# Patient Record
Sex: Female | Born: 1935 | Race: White | Hispanic: No
Health system: Southern US, Community
[De-identification: ages and names within clinical notes are randomized; demographics above are authoritative.]

## PROBLEM LIST (undated history)

## (undated) DIAGNOSIS — K219 Gastro-esophageal reflux disease without esophagitis: Secondary | ICD-10-CM

## (undated) DIAGNOSIS — J449 Chronic obstructive pulmonary disease, unspecified: Secondary | ICD-10-CM

## (undated) DIAGNOSIS — F419 Anxiety disorder, unspecified: Secondary | ICD-10-CM

## (undated) DIAGNOSIS — J189 Pneumonia, unspecified organism: Secondary | ICD-10-CM

## (undated) DIAGNOSIS — I10 Essential (primary) hypertension: Secondary | ICD-10-CM

## (undated) DIAGNOSIS — E46 Unspecified protein-calorie malnutrition: Secondary | ICD-10-CM

## (undated) DIAGNOSIS — N319 Neuromuscular dysfunction of bladder, unspecified: Secondary | ICD-10-CM

## (undated) DIAGNOSIS — Z9911 Dependence on respirator [ventilator] status: Secondary | ICD-10-CM

## (undated) DIAGNOSIS — A0472 Enterocolitis due to Clostridium difficile, not specified as recurrent: Secondary | ICD-10-CM

## (undated) DIAGNOSIS — D649 Anemia, unspecified: Secondary | ICD-10-CM

## (undated) DIAGNOSIS — I5181 Takotsubo syndrome: Secondary | ICD-10-CM

---

## 2016-03-31 ENCOUNTER — Emergency Department (HOSPITAL_COMMUNITY): Payer: Medicare Other

## 2016-03-31 ENCOUNTER — Inpatient Hospital Stay (HOSPITAL_COMMUNITY)
Admission: EM | Admit: 2016-03-31 | Discharge: 2016-04-11 | DRG: 870 | Disposition: A | Discharge status: Other Acute Inpatient Hospital | Payer: Medicare Other | Attending: Oncology | Admitting: Oncology

## 2016-03-31 ENCOUNTER — Encounter (HOSPITAL_COMMUNITY): Payer: Self-pay | Admitting: *Deleted

## 2016-03-31 DIAGNOSIS — E2740 Unspecified adrenocortical insufficiency: Secondary | ICD-10-CM | POA: Diagnosis present

## 2016-03-31 DIAGNOSIS — E1165 Type 2 diabetes mellitus with hyperglycemia: Secondary | ICD-10-CM | POA: Diagnosis present

## 2016-03-31 DIAGNOSIS — J440 Chronic obstructive pulmonary disease with acute lower respiratory infection: Secondary | ICD-10-CM | POA: Diagnosis present

## 2016-03-31 DIAGNOSIS — R739 Hyperglycemia, unspecified: Secondary | ICD-10-CM | POA: Diagnosis present

## 2016-03-31 DIAGNOSIS — Z452 Encounter for adjustment and management of vascular access device: Secondary | ICD-10-CM

## 2016-03-31 DIAGNOSIS — F419 Anxiety disorder, unspecified: Secondary | ICD-10-CM | POA: Diagnosis present

## 2016-03-31 DIAGNOSIS — Z9911 Dependence on respirator [ventilator] status: Secondary | ICD-10-CM

## 2016-03-31 DIAGNOSIS — J150 Pneumonia due to Klebsiella pneumoniae: Secondary | ICD-10-CM | POA: Diagnosis present

## 2016-03-31 DIAGNOSIS — A0472 Enterocolitis due to Clostridium difficile, not specified as recurrent: Secondary | ICD-10-CM | POA: Diagnosis present

## 2016-03-31 DIAGNOSIS — L8990 Pressure ulcer of unspecified site, unspecified stage: Secondary | ICD-10-CM | POA: Diagnosis present

## 2016-03-31 DIAGNOSIS — E875 Hyperkalemia: Secondary | ICD-10-CM | POA: Diagnosis present

## 2016-03-31 DIAGNOSIS — A4159 Other Gram-negative sepsis: Secondary | ICD-10-CM | POA: Diagnosis present

## 2016-03-31 DIAGNOSIS — J95851 Ventilator associated pneumonia: Secondary | ICD-10-CM | POA: Diagnosis present

## 2016-03-31 DIAGNOSIS — B961 Klebsiella pneumoniae [K. pneumoniae] as the cause of diseases classified elsewhere: Secondary | ICD-10-CM | POA: Diagnosis not present

## 2016-03-31 DIAGNOSIS — B952 Enterococcus as the cause of diseases classified elsewhere: Secondary | ICD-10-CM | POA: Diagnosis present

## 2016-03-31 DIAGNOSIS — E11649 Type 2 diabetes mellitus with hypoglycemia without coma: Secondary | ICD-10-CM | POA: Diagnosis present

## 2016-03-31 DIAGNOSIS — A0471 Enterocolitis due to Clostridium difficile, recurrent: Secondary | ICD-10-CM | POA: Diagnosis not present

## 2016-03-31 DIAGNOSIS — E871 Hypo-osmolality and hyponatremia: Secondary | ICD-10-CM | POA: Diagnosis present

## 2016-03-31 DIAGNOSIS — D638 Anemia in other chronic diseases classified elsewhere: Secondary | ICD-10-CM | POA: Diagnosis not present

## 2016-03-31 DIAGNOSIS — B9689 Other specified bacterial agents as the cause of diseases classified elsewhere: Secondary | ICD-10-CM

## 2016-03-31 DIAGNOSIS — E119 Type 2 diabetes mellitus without complications: Secondary | ICD-10-CM | POA: Diagnosis not present

## 2016-03-31 DIAGNOSIS — I950 Idiopathic hypotension: Secondary | ICD-10-CM | POA: Diagnosis present

## 2016-03-31 DIAGNOSIS — S301XXA Contusion of abdominal wall, initial encounter: Secondary | ICD-10-CM | POA: Diagnosis present

## 2016-03-31 DIAGNOSIS — Y95 Nosocomial condition: Secondary | ICD-10-CM | POA: Diagnosis present

## 2016-03-31 DIAGNOSIS — Z79899 Other long term (current) drug therapy: Secondary | ICD-10-CM

## 2016-03-31 DIAGNOSIS — N390 Urinary tract infection, site not specified: Secondary | ICD-10-CM | POA: Diagnosis present

## 2016-03-31 DIAGNOSIS — E46 Unspecified protein-calorie malnutrition: Secondary | ICD-10-CM | POA: Diagnosis present

## 2016-03-31 DIAGNOSIS — Z794 Long term (current) use of insulin: Secondary | ICD-10-CM

## 2016-03-31 DIAGNOSIS — J9621 Acute and chronic respiratory failure with hypoxia: Secondary | ICD-10-CM | POA: Diagnosis present

## 2016-03-31 DIAGNOSIS — A414 Sepsis due to anaerobes: Secondary | ICD-10-CM

## 2016-03-31 DIAGNOSIS — D649 Anemia, unspecified: Secondary | ICD-10-CM | POA: Diagnosis not present

## 2016-03-31 DIAGNOSIS — Z8674 Personal history of sudden cardiac arrest: Secondary | ICD-10-CM

## 2016-03-31 DIAGNOSIS — A498 Other bacterial infections of unspecified site: Secondary | ICD-10-CM | POA: Diagnosis not present

## 2016-03-31 DIAGNOSIS — I10 Essential (primary) hypertension: Secondary | ICD-10-CM | POA: Diagnosis present

## 2016-03-31 DIAGNOSIS — Z930 Tracheostomy status: Secondary | ICD-10-CM | POA: Diagnosis not present

## 2016-03-31 DIAGNOSIS — E785 Hyperlipidemia, unspecified: Secondary | ICD-10-CM | POA: Diagnosis present

## 2016-03-31 DIAGNOSIS — Z7952 Long term (current) use of systemic steroids: Secondary | ICD-10-CM

## 2016-03-31 DIAGNOSIS — R093 Abnormal sputum: Secondary | ICD-10-CM

## 2016-03-31 DIAGNOSIS — Z6825 Body mass index (BMI) 25.0-25.9, adult: Secondary | ICD-10-CM

## 2016-03-31 DIAGNOSIS — K9420 Gastrostomy complication, unspecified: Secondary | ICD-10-CM

## 2016-03-31 DIAGNOSIS — Y848 Other medical procedures as the cause of abnormal reaction of the patient, or of later complication, without mention of misadventure at the time of the procedure: Secondary | ICD-10-CM | POA: Diagnosis present

## 2016-03-31 DIAGNOSIS — K219 Gastro-esophageal reflux disease without esophagitis: Secondary | ICD-10-CM | POA: Diagnosis not present

## 2016-03-31 DIAGNOSIS — I5181 Takotsubo syndrome: Secondary | ICD-10-CM | POA: Diagnosis present

## 2016-03-31 DIAGNOSIS — K651 Peritoneal abscess: Secondary | ICD-10-CM | POA: Diagnosis not present

## 2016-03-31 DIAGNOSIS — J9601 Acute respiratory failure with hypoxia: Secondary | ICD-10-CM

## 2016-03-31 DIAGNOSIS — L309 Dermatitis, unspecified: Secondary | ICD-10-CM | POA: Diagnosis present

## 2016-03-31 DIAGNOSIS — J189 Pneumonia, unspecified organism: Secondary | ICD-10-CM | POA: Diagnosis not present

## 2016-03-31 DIAGNOSIS — Z8701 Personal history of pneumonia (recurrent): Secondary | ICD-10-CM

## 2016-03-31 DIAGNOSIS — L03311 Cellulitis of abdominal wall: Secondary | ICD-10-CM | POA: Diagnosis present

## 2016-03-31 DIAGNOSIS — Z66 Do not resuscitate: Secondary | ICD-10-CM | POA: Diagnosis present

## 2016-03-31 DIAGNOSIS — Z888 Allergy status to other drugs, medicaments and biological substances status: Secondary | ICD-10-CM

## 2016-03-31 DIAGNOSIS — L02211 Cutaneous abscess of abdominal wall: Secondary | ICD-10-CM | POA: Diagnosis present

## 2016-03-31 DIAGNOSIS — Z1621 Resistance to vancomycin: Secondary | ICD-10-CM | POA: Diagnosis present

## 2016-03-31 DIAGNOSIS — A491 Streptococcal infection, unspecified site: Secondary | ICD-10-CM

## 2016-03-31 DIAGNOSIS — L8942 Pressure ulcer of contiguous site of back, buttock and hip, stage 2: Secondary | ICD-10-CM | POA: Diagnosis not present

## 2016-03-31 DIAGNOSIS — J9610 Chronic respiratory failure, unspecified whether with hypoxia or hypercapnia: Secondary | ICD-10-CM | POA: Diagnosis not present

## 2016-03-31 HISTORY — DX: Takotsubo syndrome: I51.81

## 2016-03-31 HISTORY — DX: Neuromuscular dysfunction of bladder, unspecified: N31.9

## 2016-03-31 HISTORY — DX: Dependence on respirator (ventilator) status: Z99.11

## 2016-03-31 HISTORY — DX: Anxiety disorder, unspecified: F41.9

## 2016-03-31 HISTORY — DX: Pneumonia, unspecified organism: J18.9

## 2016-03-31 HISTORY — DX: Essential (primary) hypertension: I10

## 2016-03-31 HISTORY — DX: Anemia, unspecified: D64.9

## 2016-03-31 HISTORY — DX: Chronic obstructive pulmonary disease, unspecified: J44.9

## 2016-03-31 HISTORY — DX: Gastro-esophageal reflux disease without esophagitis: K21.9

## 2016-03-31 HISTORY — DX: Enterocolitis due to Clostridium difficile, not specified as recurrent: A04.72

## 2016-03-31 HISTORY — DX: Unspecified protein-calorie malnutrition: E46

## 2016-03-31 LAB — COMPREHENSIVE METABOLIC PANEL
ALBUMIN: 1.9 g/dL — AB (ref 3.5–5.0)
ALT: 26 U/L (ref 14–54)
AST: 22 U/L (ref 15–41)
Alkaline Phosphatase: 100 U/L (ref 38–126)
Anion gap: 8 (ref 5–15)
BILIRUBIN TOTAL: 0.3 mg/dL (ref 0.3–1.2)
BUN: 50 mg/dL — AB (ref 6–20)
CO2: 22 mmol/L (ref 22–32)
CREATININE: 0.7 mg/dL (ref 0.44–1.00)
Calcium: 8.7 mg/dL — ABNORMAL LOW (ref 8.9–10.3)
Chloride: 97 mmol/L — ABNORMAL LOW (ref 101–111)
GFR calc Af Amer: >60 mL/min (ref >60)
GLUCOSE: 481 mg/dL — AB (ref 65–99)
POTASSIUM: 6.2 mmol/L — AB (ref 3.5–5.1)
Sodium: 127 mmol/L — ABNORMAL LOW (ref 135–145)
TOTAL PROTEIN: 5.6 g/dL — AB (ref 6.5–8.1)

## 2016-03-31 LAB — I-STAT ARTERIAL BLOOD GAS, ED
Acid-base deficit: 2 mmol/L (ref 0.0–2.0)
BICARBONATE: 22.6 mmol/L (ref 20.0–28.0)
O2 Saturation: 97 %
PCO2 ART: 38.1 mmHg (ref 32.0–48.0)
PO2 ART: 88 mmHg (ref 83.0–108.0)
TCO2: 24 mmol/L (ref 0–100)
pH, Arterial: 7.382 (ref 7.350–7.450)

## 2016-03-31 LAB — I-STAT CHEM 8, ED
BUN: 46 mg/dL — AB (ref 6–20)
CALCIUM ION: 1.16 mmol/L (ref 1.15–1.40)
CREATININE: 0.6 mg/dL (ref 0.44–1.00)
Chloride: 100 mmol/L — ABNORMAL LOW (ref 101–111)
GLUCOSE: 450 mg/dL — AB (ref 65–99)
HCT: 37 % (ref 36.0–46.0)
HEMOGLOBIN: 12.6 g/dL (ref 12.0–15.0)
Potassium: 6.4 mmol/L (ref 3.5–5.1)
Sodium: 124 mmol/L — ABNORMAL LOW (ref 135–145)
TCO2: 23 mmol/L (ref 0–100)

## 2016-03-31 LAB — CBC WITH DIFFERENTIAL/PLATELET
BASOS ABS: 0 10*3/uL (ref 0.0–0.1)
Basophils Relative: 0 %
EOS PCT: 0 %
Eosinophils Absolute: 0 10*3/uL (ref 0.0–0.7)
HEMATOCRIT: 20.1 % — AB (ref 36.0–46.0)
Hemoglobin: 6.4 g/dL — CL (ref 12.0–15.0)
LYMPHS ABS: 0.4 10*3/uL — AB (ref 0.7–4.0)
Lymphocytes Relative: 2 %
MCH: 29.5 pg (ref 26.0–34.0)
MCHC: 31.8 g/dL (ref 30.0–36.0)
MCV: 92.6 fL (ref 78.0–100.0)
MONO ABS: 0.2 10*3/uL (ref 0.1–1.0)
MONOS PCT: 1 %
NEUTROS ABS: 18 10*3/uL — AB (ref 1.7–7.7)
Neutrophils Relative %: 97 %
PLATELETS: 262 10*3/uL (ref 150–400)
RBC: 2.17 MIL/uL — AB (ref 3.87–5.11)
RDW: 16.6 % — AB (ref 11.5–15.5)
WBC: 18.6 10*3/uL — AB (ref 4.0–10.5)

## 2016-03-31 LAB — I-STAT TROPONIN, ED: Troponin i, poc: 0.04 ng/mL (ref 0.00–0.08)

## 2016-03-31 LAB — PREPARE RBC (CROSSMATCH)

## 2016-03-31 LAB — BETA-HYDROXYBUTYRIC ACID: Beta-Hydroxybutyric Acid: 0.23 mmol/L (ref 0.05–0.27)

## 2016-03-31 LAB — I-STAT CG4 LACTIC ACID, ED: Lactic Acid, Venous: 1.7 mmol/L (ref 0.5–1.9)

## 2016-03-31 MED ORDER — PIPERACILLIN-TAZOBACTAM 3.375 G IVPB 30 MIN
3.3750 g | Freq: Once | INTRAVENOUS | Status: AC
  Administered 2016-04-01: 3.375 g via INTRAVENOUS
  Filled 2016-03-31: qty 50

## 2016-03-31 MED ORDER — SODIUM CHLORIDE 0.9 % IV SOLN
Freq: Once | INTRAVENOUS | Status: AC
  Administered 2016-04-01: 01:00:00 via INTRAVENOUS

## 2016-03-31 MED ORDER — SODIUM CHLORIDE 0.9 % IV SOLN
1.0000 g | Freq: Once | INTRAVENOUS | Status: AC
  Administered 2016-03-31: 1 g via INTRAVENOUS
  Filled 2016-03-31: qty 10

## 2016-03-31 NOTE — H&P (Signed)
Date: 03/31/2016               Patient Name:  Janice BlackwaterCatherine Jabbour MRN: [id number]  DOB: [birth year] Age / Sex: 82 y.o., female   PCP: No primary care provider on file.         Medical Service: Internal Medicine Teaching Service         Attending Physician: Dr. Blane OharaJoshua Zavitz, MD    First Contact: Dr. Samuella CotaSvalina  Pager: [phone]  Second Contact: Dr. Johnny BridgeSaraiya Pager: [phone]       After Hours (After 5p/  First Contact Pager: [phone]  weekends / holidays): Second Contact Pager: [phone]   Chief Complaint: Abnormal labs  History of Present Illness: Patient is an 82 yo F ventilator dependent with a trach, pmhx of HTN, COPD, and GERD who [facility] for reported hyperkalemia. Patient is non verbal and only able to answer yes/no questions with a nod. She is alert to person, place, and time with yes/no questioning. She is not feeling well. She admits to chest pain, shortness of breath, but denies cough. She denies fever, chills, dysuria. When asked if we could contact her family for further history, patient shook her head no.   In the ED, vitals were stable (T98.5 rectal, BP 116/68, HR 89, RR 21, Oxygen 99% on vent). Labs significant for hyperkalemia 6.2, hyponatremia 127, anemia hgb 6.4, leukocytosis wbc 18.6, and hyperglycemia with glucose 481. Venous lactic acid was elevated to 2.44, ABG was within normal limits. On repeat labs, hemoglobin was 12.6 but potassium remained elevated at 6.4. EKG was concerning for some mild ST elevation and depression, no priors in our system for comparison. I-stat troponin was 0.04. She was given  IV calcium gluconate x 1 and potassium improved to 4.1. CXR 1 view noted some mild bilateral opacities possibly atelectasis vs mild PNA as well as some peribronchial thickening. She was started on IV zosyn for pseudomonal coverage.   Meds:  Current Meds  Medication Sig  . busPIRone (BUSPAR) 10 MG tablet Place 10 mg into feeding tube 3 (three)  times daily.  . chlorhexidine (PERIDEX) 0.12 % solution Use as directed 15 mLs in the mouth or throat 2 (two) times daily.  . cholestyramine (QUESTRAN) 4 g packet Place 4 g into feeding tube 4 (four) times daily.  Marland Kitchen. dexamethasone (DECADRON) 4 MG tablet Place 4 mg into feeding tube daily.  . ertapenem 1 g in sodium chloride 0.9 % 50 mL Inject 1 g into the vein daily.  . famotidine (PEPCID) 20 MG tablet Place 20 mg into feeding tube daily.  . fluconazole (DIFLUCAN) 200 MG tablet Place 200 mg into feeding tube daily.  . heparin 5000 UNIT/ML injection Inject 5,000 Units into the skin 2 (two) times daily.  . insulin glargine (LANTUS) 100 UNIT/ML injection Inject 10 Units into the skin at bedtime.  . insulin lispro (HUMALOG) 100 UNIT/ML injection Inject 2-10 Units into the skin every 6 (six) hours. 0-150=0 units 151-200=2 units 201-250=4 units 251-300=6 units 301-350=8 units 351-400=10 unts >400=call MD  . insulin lispro (HUMALOG) 100 UNIT/ML injection Inject 10 Units into the skin daily.  Marland Kitchen. ipratropium-albuterol (DUONEB) 0.5-2.5 (3) MG/3ML SOLN Take 3 mLs by nebulization every 6 (six) hours.  Marland Kitchen. loperamide (IMODIUM A-D) 2 MG tablet Place 2 mg into feeding tube 4 (four) times daily as needed for diarrhea or loose stools.  Marland Kitchen. LORazepam (ATIVAN) 0.5 MG tablet Place 0.5 mg into feeding tube every 8 (eight) hours.  .Marland Kitchen  mirtazapine (REMERON) 15 MG tablet Place 15 mg into feeding tube at bedtime.  . Nutritional Supplements (FEEDING SUPPLEMENT, GLUCERNA 1.2 CAL,) LIQD Place 1,000 mLs into feeding tube continuous.  Marland Kitchen. oxyCODONE-acetaminophen (PERCOCET/ROXICET) 5-325 MG tablet Place 1 tablet into feeding tube every 6 (six) hours as needed for severe pain.  Marland Kitchen. terbinafine (LAMISIL) 1 % cream Apply 1 application topically 2 (two) times daily.  . traMADol (ULTRAM) 50 MG tablet Place 25 mg into feeding tube 2 (two) times daily.  Marland Kitchen. VANCOMYCIN HCL PO Place 500 mg into feeding tube every 6 (six) hours.      Allergies: Allergies as of 03/31/2016 - Review Complete 03/31/2016  Allergen Reaction Noted  . Tape Itching 03/20/2012   Past Medical History:  Diagnosis Date  . Anxiety   . C. difficile colitis   . Chronic anemia   . COPD (chronic obstructive pulmonary disease) (HCC)   . GERD (gastroesophageal reflux disease)   . Healthcare-associated pneumonia   . Hypertension   . Neuropathic bladder   . Pneumonia   . Protein calorie malnutrition (HCC)   . Takotsubo cardiomyopathy   . Ventilator dependent (HCC)     Family History: Unable to obtain   Social History: Ventilator dependent, non verbal at baseline. Can answer yes / no questions with nodding. [facility].  Review of Systems: A complete ROS was limited due to non verbal baseline.   Physical Exam: Blood pressure 110/66, pulse 83, temperature 98.5 F (36.9 C), temperature source Rectal, resp. rate 24, SpO2 99 %. Physical Exam Constitutional: Diaphoretic, ill appearing woman  HEENT: Atraumatic, normocephalic. PERRL, anicteric sclera.  Neck: Tracheostomy Cardiovascular: RRR, no murmurs, rubs, or gallops.  Pulmonary/Chest: On ventilator, CTAB, no wheezes, rales, or rhonchi.  Abdominal: Soft, non tender, non distended. +BS.  Extremities: Warm and well perfused. Distal pulses intact. No edema.  Neurological: A&Ox3 with yes/no questioning, CN II - XII grossly intact.  Skin: No rashes or erythema  Psychiatric: Normal mood and affect  EKG: Personally reviewed. NSR with T wave inversions in V1-V2, wide QRS, and J point elevation in V4-V6. No priors for comparison.   CXR: Personally reviewed. Patchy bilateral opacities, L>R. Possible PNA.  Assessment & Plan by Problem:  Hyperkalemia: S/p IV Calcium gluconate x 1 in ED. Improved 6.2 -> 4.1. EKG without peaked T waves. Possible RBBB and T wave inversions in V1-V2. I-stat troponin 0.04.  -- Trend troponins  -- Cardiac monitoring  -- Repeat AM labs  PNA:  Patchy bilateral opacities on CXR. Patient is vent dependent with trach, non verbal at baseline. She does endorse chest pain. Afebrile with leukocytosis of 18. . Patient may have been receiving Vancomycin and Ertapenem at [facility]; however, we have no records to support this-only an Epic order in home meds. She does have a history of Enterobacter Cloacae in sputum. Continuing Zosyn for now, but we need records ASAP. Also obtained sputum culture. -- Continue IV Zosyn  -- Sputum culture -- Continue home percocet per tube q6hrs prn  -- Continue home tramadol 25 mg BID -- Duonebs q6hrs scheduled -- Continue dexamethasone 4 mg daily  -- SLP eval  Hyponatremia: Sodium 127 on admission labs -- NS 125 cc/hr x 10 hrs -- Repeat AM labs  Anemia: Hgb 6.4 on admission but came up to 12.6 on repeat prior to blood transfusion. Possibly lab error, will repeat H&H. Hold off on transfusion for now.  -- FOBT -- H&H pending   DM II: -- Lantus 10 mg  QHS -- SSI  HLD: -- Continue home Cholestyramine 4 mg QID per tube  GERD: -- Continue home pepcid 20 mg per tube daily   Anxiety: -- Continue home Buspar -- Continue home ativan 0.5 q8hrs prn per tube -- Continue home remeron 15 mg QHS  Diarrhea: -- Continue home loperamide   FEN: NS 125 cc/hr x 10 hours, replete lytes prn, tube feeds and dysphagia 1, SLP eval pending VTE ppx: Lovenox  Code Status: DNR  Dispo: Admit patient to Inpatient with expected length of stay greater than 2 midnights.  Signed: Reymundo Poll, MD 03/31/2016, 11:26 PM  Pager: [phone]

## 2016-03-31 NOTE — ED Provider Notes (Signed)
[facility]-EMERGENCY DEPT Provider Note   CSN: [id number] Arrival date & time: 03/31/16  1940     History   Chief Complaint Chief Complaint  Patient presents with   Other    Hyperkalemia    HPI Janice Watson is a 82 y.o. female.   Illness  This is a new problem. The current episode started 3 to 5 hours ago. The problem occurs constantly. The problem has not changed since onset.Associated symptoms comments: Hyperkalemia. Nothing aggravates the symptoms. Nothing relieves the symptoms. She has tried nothing for the symptoms.    Past Medical History:  Diagnosis Date   Anxiety    C. difficile colitis    Chronic anemia    COPD (chronic obstructive pulmonary disease) (HCC)    GERD (gastroesophageal reflux disease)    Healthcare-associated pneumonia    Hypertension    Neuropathic bladder    Pneumonia    Protein calorie malnutrition (HCC)    Takotsubo cardiomyopathy    Ventilator dependent [facility](HCC)     Patient Active Problem List   Diagnosis Date Noted   Anemia 03/31/2016    No past surgical history on file.  OB History    No data available       Home Medications    Prior to Admission medications   Medication Sig Start Date End Date Taking? Authorizing Provider  busPIRone (BUSPAR) 10 MG tablet Place 10 mg into feeding tube 3 (three) times daily.   Yes Historical Provider, MD  chlorhexidine (PERIDEX) 0.12 % solution Use as directed 15 mLs in the mouth or throat 2 (two) times daily.   Yes Historical Provider, MD  cholestyramine (QUESTRAN) 4 g packet Place 4 g into feeding tube 4 (four) times daily.   Yes Historical Provider, MD  dexamethasone (DECADRON) 4 MG tablet Place 4 mg into feeding tube daily.   Yes Historical Provider, MD  ertapenem 1 g in sodium chloride 0.9 % 50 mL Inject 1 g into the vein daily.   Yes Historical Provider, MD  famotidine (PEPCID) 20 MG tablet Place 20 mg into feeding tube daily.   Yes Historical Provider, MD  fluconazole  (DIFLUCAN) 200 MG tablet Place 200 mg into feeding tube daily.   Yes Historical Provider, MD  heparin 5000 UNIT/ML injection Inject 5,000 Units into the skin 2 (two) times daily.   Yes Historical Provider, MD  insulin glargine (LANTUS) 100 UNIT/ML injection Inject 10 Units into the skin at bedtime.   Yes Historical Provider, MD  insulin lispro (HUMALOG) 100 UNIT/ML injection Inject 2-10 Units into the skin every 6 (six) hours. 0-150=0 units 151-200=2 units 201-250=4 units 251-300=6 units 301-350=8 units 351-400=10 unts >400=call MD   Yes Historical Provider, MD  insulin lispro (HUMALOG) 100 UNIT/ML injection Inject 10 Units into the skin daily.   Yes Historical Provider, MD  ipratropium-albuterol (DUONEB) 0.5-2.5 (3) MG/3ML SOLN Take 3 mLs by nebulization every 6 (six) hours.   Yes Historical Provider, MD  loperamide (IMODIUM A-D) 2 MG tablet Place 2 mg into feeding tube 4 (four) times daily as needed for diarrhea or loose stools.   Yes Historical Provider, MD  LORazepam (ATIVAN) 0.5 MG tablet Place 0.5 mg into feeding tube every 8 (eight) hours.   Yes Historical Provider, MD  mirtazapine (REMERON) 15 MG tablet Place 15 mg into feeding tube at bedtime.   Yes Historical Provider, MD  Nutritional Supplements (FEEDING SUPPLEMENT, GLUCERNA 1.2 CAL,) LIQD Place 1,000 mLs into feeding tube continuous.   Yes Historical Provider, MD  oxyCODONE-acetaminophen (  PERCOCET/ROXICET) 5-325 MG tablet Place 1 tablet into feeding tube every 6 (six) hours as needed for severe pain.   Yes Historical Provider, MD  terbinafine (LAMISIL) 1 % cream Apply 1 application topically 2 (two) times daily.   Yes Historical Provider, MD  traMADol (ULTRAM) 50 MG tablet Place 25 mg into feeding tube 2 (two) times daily.   Yes Historical Provider, MD  VANCOMYCIN HCL PO Place 500 mg into feeding tube every 6 (six) hours.   Yes Historical Provider, MD    Family History No family history on file.  Social History Social History    Substance Use Topics   Smoking status: Not on file   Smokeless tobacco: Not on file   Alcohol use Not on file     Allergies   Tape   Review of Systems Review of Systems  Unable to perform ROS: Acuity of condition  All other systems reviewed and are negative.    Physical Exam Updated Vital Signs BP 110/66    Pulse 83    Temp 98.5 F (36.9 C) (Rectal)    Resp 24    SpO2 99%   Physical Exam  Constitutional: She appears well-developed and well-nourished. She appears lethargic. No distress.  HENT:  Head: Normocephalic and atraumatic.  Eyes: Conjunctivae are normal.  Neck: Neck supple.  Trach placed, clean dry and intact  Cardiovascular: Normal rate and regular rhythm.   No murmur heard. Pulmonary/Chest: Effort normal. No respiratory distress. She has rhonchi.  Mechanical lung sounds  Abdominal: Soft. There is no tenderness.  Musculoskeletal: She exhibits no edema.  Neurological: She appears lethargic. GCS eye subscore is 3.  Not following commands so unable to test motor function or sensation/coordination  Skin: Skin is warm and dry.  Psychiatric: She has a normal mood and affect.  Nursing note and vitals reviewed.    ED Treatments / Results  Labs (all labs ordered are listed, but only abnormal results are displayed) Labs Reviewed  COMPREHENSIVE METABOLIC PANEL - Abnormal; Notable for the following:       Result Value   Sodium 127 (*)    Potassium 6.2 (*)    Chloride 97 (*)    Glucose, Bld 481 (*)    BUN 50 (*)    Calcium 8.7 (*)    Total Protein 5.6 (*)    Albumin 1.9 (*)    All other components within normal limits  CBC WITH DIFFERENTIAL/PLATELET - Abnormal; Notable for the following:    WBC 18.6 (*)    RBC 2.17 (*)    Hemoglobin 6.4 (*)    HCT 20.1 (*)    RDW 16.6 (*)    All other components within normal limits  I-STAT CHEM 8, ED - Abnormal; Notable for the following:    Sodium 124 (*)    Potassium 6.4 (*)    Chloride 100 (*)    BUN 46 (*)     Glucose, Bld 450 (*)    All other components within normal limits  URINE CULTURE  BETA-HYDROXYBUTYRIC ACID  URINALYSIS, ROUTINE W REFLEX MICROSCOPIC (NOT AT [facility])  BLOOD GAS, ARTERIAL  BASIC METABOLIC PANEL  I-STAT CG4 LACTIC ACID, ED  I-STAT TROPOININ, ED  I-STAT ARTERIAL BLOOD GAS, ED  I-STAT CG4 LACTIC ACID, ED  TYPE AND SCREEN  PREPARE RBC (CROSSMATCH)    EKG  EKG Interpretation  Date/Time:  Saturday March 31 2016 20:07:05 EST Ventricular Rate:  88 PR Interval:    QRS Duration: 128 QT Interval:  373  QTC Calculation: 452 R Axis:   142 Text Interpretation:  Sinus rhythm Atrial premature complex RBBB and LPFB ST changes, no old ekg Confirmed by Jodi Mourning MD, JOSHUA [id number]) on 03/31/2016 8:14:32 PM       Radiology Dg Chest Port 1 View  Result Date: 03/31/2016 CLINICAL DATA:  PICC placement.  Initial encounter. EXAM: PORTABLE CHEST 1 VIEW COMPARISON:  None. FINDINGS: A right PICC is noted ending about the mid SVC. The patient's tracheostomy tube is seen ending 5 cm above the carina. The lungs are well-aerated. Mild bilateral opacities may reflect atelectasis or possibly mild pneumonia. Peribronchial thickening is seen. No definite pleural effusion or pneumothorax is seen. The cardiomediastinal silhouette is borderline normal in size. Chronic left-sided rib deformities are noted, and scattered soft tissue air is noted overlying the left chest wall, of uncertain significance. IMPRESSION: 1. Right PICC noted ending about the mid SVC. 2. Mild bilateral opacities may reflect atelectasis or possibly mild pneumonia. Peribronchial thickening seen. Electronically Signed   By: Roanna Raider M.D.   On: 03/31/2016 20:56    Procedures Procedures (including critical care time)  Medications Ordered in ED Medications  0.9 %  sodium chloride infusion (not administered)  piperacillin-tazobactam (ZOSYN) IVPB 3.375 g (not administered)  calcium gluconate 1 g in sodium chloride 0.9 % 100 mL  IVPB (0 g Intravenous Stopped 03/31/16 2205)     Initial Impression / Assessment and Plan / ED Course  I have reviewed the triage vital signs and the nursing notes.  Pertinent labs & imaging results that were available during my care of the patient were reviewed by me and considered in my medical decision making (see chart for details).  Clinical Course     82 year old female who is trached and vented pendant comes to [place] from her nursing home with concerns for hyperkalemia. This patient has pale conjunctiva, she is normotensive and she is not tachycardic. She is on her normal vent settings. Lung sound rhonchorous bilaterally mechanical sounds. Heart sounds normal. She's afebrile. Chest x-ray is concerning for multifocal pneumonia versus aspiration. With her trach, we will answer both with Zosyn. She is anemic to 52 units of blood are ordered. EKG shows some precordial ST depressions. As well as maybe 1 mm of elevation. Troponin is negative. Lactic acid is under 2. Stool is neither melenic or bright red blood. She will be admitted to the stepdown unit for further management and investigation of her anemia hyperkalemia and pneumonia. With regards to her hyperkalemia was 6.5. No peak T waves however there were EKG changes so we did give calcium. Will repeat a BMP 4 hours after. Vital signs stable time and off. Further manage this patient's care please see stepdown unit.  Final Clinical Impressions(s) / ED Diagnoses   Final diagnoses:  PICC (peripherally inserted central catheter) in place  Anemia, unspecified type  Hyperkalemia  HCAP (healthcare-associated pneumonia)    New Prescriptions New Prescriptions   No medications on file     Blane Ohara, MD 04/01/16 [phone]

## 2016-03-31 NOTE — ED Notes (Signed)
Patient comes from [facility] facility for reported hyperkalemia.  Potassium was 6.4 for the facility.  Patient is vent dependent with a trach.  Patient's cognition unknown baseline.  Responsive to pain only at this time.  Positive for pneumonia, c-diff at this time per report of EMS.

## 2016-03-31 NOTE — ED Notes (Signed)
Dr Jodi MourningZavitz was made aware of the critical lab..(KT)

## 2016-04-01 DIAGNOSIS — Z794 Long term (current) use of insulin: Secondary | ICD-10-CM

## 2016-04-01 DIAGNOSIS — A0472 Enterocolitis due to Clostridium difficile, not specified as recurrent: Secondary | ICD-10-CM

## 2016-04-01 DIAGNOSIS — Z960 Presence of urogenital implants: Secondary | ICD-10-CM

## 2016-04-01 DIAGNOSIS — F419 Anxiety disorder, unspecified: Secondary | ICD-10-CM

## 2016-04-01 DIAGNOSIS — Z79899 Other long term (current) drug therapy: Secondary | ICD-10-CM

## 2016-04-01 DIAGNOSIS — K219 Gastro-esophageal reflux disease without esophagitis: Secondary | ICD-10-CM

## 2016-04-01 DIAGNOSIS — E785 Hyperlipidemia, unspecified: Secondary | ICD-10-CM

## 2016-04-01 DIAGNOSIS — E119 Type 2 diabetes mellitus without complications: Secondary | ICD-10-CM

## 2016-04-01 DIAGNOSIS — B9689 Other specified bacterial agents as the cause of diseases classified elsewhere: Secondary | ICD-10-CM

## 2016-04-01 DIAGNOSIS — J95851 Ventilator associated pneumonia: Secondary | ICD-10-CM | POA: Diagnosis present

## 2016-04-01 DIAGNOSIS — R739 Hyperglycemia, unspecified: Secondary | ICD-10-CM | POA: Diagnosis present

## 2016-04-01 DIAGNOSIS — E871 Hypo-osmolality and hyponatremia: Secondary | ICD-10-CM | POA: Diagnosis present

## 2016-04-01 DIAGNOSIS — D638 Anemia in other chronic diseases classified elsewhere: Secondary | ICD-10-CM

## 2016-04-01 DIAGNOSIS — R197 Diarrhea, unspecified: Secondary | ICD-10-CM

## 2016-04-01 DIAGNOSIS — E875 Hyperkalemia: Secondary | ICD-10-CM | POA: Diagnosis present

## 2016-04-01 LAB — CBG MONITORING, ED: Glucose-Capillary: 346 mg/dL — ABNORMAL HIGH (ref 65–99)

## 2016-04-01 LAB — CBC
HEMATOCRIT: 34.8 % — AB (ref 36.0–46.0)
Hemoglobin: 11 g/dL — ABNORMAL LOW (ref 12.0–15.0)
MCH: 28.6 pg (ref 26.0–34.0)
MCHC: 31.6 g/dL (ref 30.0–36.0)
MCV: 90.4 fL (ref 78.0–100.0)
PLATELETS: 176 10*3/uL (ref 150–400)
RBC: 3.85 MIL/uL — AB (ref 3.87–5.11)
RDW: 15.9 % — AB (ref 11.5–15.5)
WBC: 10.2 10*3/uL (ref 4.0–10.5)

## 2016-04-01 LAB — BASIC METABOLIC PANEL
ANION GAP: 5 (ref 5–15)
Anion gap: 11 (ref 5–15)
Anion gap: 9 (ref 5–15)
BUN: 11 mg/dL (ref 6–20)
BUN: 50 mg/dL — AB (ref 6–20)
BUN: 42 mg/dL — ABNORMAL HIGH (ref 6–20)
CALCIUM: 8.7 mg/dL — AB (ref 8.9–10.3)
CALCIUM: 9.2 mg/dL (ref 8.9–10.3)
CHLORIDE: 104 mmol/L (ref 101–111)
CO2: 21 mmol/L — ABNORMAL LOW (ref 22–32)
CO2: 22 mmol/L (ref 22–32)
CO2: 21 mmol/L — AB (ref 22–32)
CREATININE: 0.79 mg/dL (ref 0.44–1.00)
CREATININE: 0.85 mg/dL (ref 0.44–1.00)
Calcium: 8.3 mg/dL — ABNORMAL LOW (ref 8.9–10.3)
Chloride: 106 mmol/L (ref 101–111)
Chloride: 97 mmol/L — ABNORMAL LOW (ref 101–111)
Creatinine, Ser: 0.75 mg/dL (ref 0.44–1.00)
GFR calc non Af Amer: >60 mL/min (ref >60)
GFR calc non Af Amer: >60 mL/min (ref >60)
Glucose, Bld: 101 mg/dL — ABNORMAL HIGH (ref 65–99)
Glucose, Bld: 382 mg/dL — ABNORMAL HIGH (ref 65–99)
Glucose, Bld: 198 mg/dL — ABNORMAL HIGH (ref 65–99)
POTASSIUM: 5.1 mmol/L (ref 3.5–5.1)
Potassium: 4.1 mmol/L (ref 3.5–5.1)
Potassium: 5.8 mmol/L — ABNORMAL HIGH (ref 3.5–5.1)
SODIUM: 130 mmol/L — AB (ref 135–145)
SODIUM: 138 mmol/L (ref 135–145)
Sodium: 128 mmol/L — ABNORMAL LOW (ref 135–145)

## 2016-04-01 LAB — URINE MICROSCOPIC-ADD ON

## 2016-04-01 LAB — URINALYSIS, ROUTINE W REFLEX MICROSCOPIC
Bilirubin Urine: NEGATIVE
GLUCOSE, UA: NEGATIVE mg/dL
KETONES UR: NEGATIVE mg/dL
Nitrite: NEGATIVE
PH: 6 (ref 5.0–8.0)
Protein, ur: 30 mg/dL — AB
SPECIFIC GRAVITY, URINE: 1.022 (ref 1.005–1.030)

## 2016-04-01 LAB — GLUCOSE, CAPILLARY
GLUCOSE-CAPILLARY: 150 mg/dL — AB (ref 65–99)
Glucose-Capillary: 362 mg/dL — ABNORMAL HIGH (ref 65–99)
Glucose-Capillary: 292 mg/dL — ABNORMAL HIGH (ref 65–99)
Glucose-Capillary: 162 mg/dL — ABNORMAL HIGH (ref 65–99)

## 2016-04-01 LAB — COMPREHENSIVE METABOLIC PANEL
ALT: 24 U/L (ref 14–54)
AST: 21 U/L (ref 15–41)
Albumin: 1.8 g/dL — ABNORMAL LOW (ref 3.5–5.0)
Alkaline Phosphatase: 98 U/L (ref 38–126)
Anion gap: 9 (ref 5–15)
BUN: 48 mg/dL — ABNORMAL HIGH (ref 6–20)
CO2: 21 mmol/L — ABNORMAL LOW (ref 22–32)
Calcium: 8.6 mg/dL — ABNORMAL LOW (ref 8.9–10.3)
Chloride: 100 mmol/L — ABNORMAL LOW (ref 101–111)
Creatinine, Ser: 0.93 mg/dL (ref 0.44–1.00)
GFR calc Af Amer: >60 mL/min (ref >60)
GFR calc non Af Amer: 56 mL/min — ABNORMAL LOW (ref >60)
Glucose, Bld: 391 mg/dL — ABNORMAL HIGH (ref 65–99)
Potassium: 4.8 mmol/L (ref 3.5–5.1)
Sodium: 130 mmol/L — ABNORMAL LOW (ref 135–145)
Total Bilirubin: 0.4 mg/dL (ref 0.3–1.2)
Total Protein: 5.4 g/dL — ABNORMAL LOW (ref 6.5–8.1)

## 2016-04-01 LAB — I-STAT CG4 LACTIC ACID, ED: LACTIC ACID, VENOUS: 2.44 mmol/L — AB (ref 0.5–1.9)

## 2016-04-01 LAB — TROPONIN I
TROPONIN I: 0.05 ng/mL — AB (ref <0.03)
Troponin I: 0.04 ng/mL (ref <0.03)

## 2016-04-01 LAB — HEMOGLOBIN AND HEMATOCRIT, BLOOD
HEMATOCRIT: 33.5 % — AB (ref 36.0–46.0)
HEMOGLOBIN: 10.5 g/dL — AB (ref 12.0–15.0)

## 2016-04-01 LAB — ABO/RH: ABO/RH(D): B NEG

## 2016-04-01 LAB — MAGNESIUM: Magnesium: 1.9 mg/dL (ref 1.7–2.4)

## 2016-04-01 LAB — PROCALCITONIN: Procalcitonin: 0.62 ng/mL

## 2016-04-01 LAB — MRSA PCR SCREENING: MRSA by PCR: NEGATIVE

## 2016-04-01 MED ORDER — SODIUM CHLORIDE 0.9 % IV SOLN
INTRAVENOUS | Status: AC
  Administered 2016-04-01: 06:00:00 via INTRAVENOUS

## 2016-04-01 MED ORDER — INSULIN ASPART 100 UNIT/ML ~~LOC~~ SOLN
0.0000 [IU] | Freq: Three times a day (TID) | SUBCUTANEOUS | Status: DC
  Administered 2016-04-01: 3 [IU] via SUBCUTANEOUS
  Administered 2016-04-01: 15 [IU] via SUBCUTANEOUS
  Administered 2016-04-01: 8 [IU] via SUBCUTANEOUS

## 2016-04-01 MED ORDER — FAMOTIDINE 20 MG PO TABS
20.0000 mg | ORAL_TABLET | Freq: Every day | ORAL | Status: DC
  Administered 2016-04-01 – 2016-04-11 (×11): 20 mg
  Filled 2016-04-01 (×11): qty 1

## 2016-04-01 MED ORDER — OXYCODONE-ACETAMINOPHEN 5-325 MG PO TABS: 1.0000 | ORAL_TABLET | Freq: Four times a day (QID) | ORAL | Status: DC | PRN

## 2016-04-01 MED ORDER — SODIUM CHLORIDE 0.9% FLUSH
3.0000 mL | Freq: Two times a day (BID) | INTRAVENOUS | Status: DC
  Administered 2016-04-01: 3 mL via INTRAVENOUS
  Administered 2016-04-01: 10 mL via INTRAVENOUS
  Administered 2016-04-02 – 2016-04-10 (×12): 3 mL via INTRAVENOUS

## 2016-04-01 MED ORDER — INSULIN ASPART 100 UNIT/ML ~~LOC~~ SOLN: 0.0000 [IU] | Freq: Every day | SUBCUTANEOUS | Status: DC

## 2016-04-01 MED ORDER — DEXTROSE 5 % IV SOLN
1.0000 g | Freq: Two times a day (BID) | INTRAVENOUS | Status: DC
  Administered 2016-04-01 – 2016-04-04 (×7): 1 g via INTRAVENOUS
  Filled 2016-04-01 (×8): qty 1

## 2016-04-01 MED ORDER — PIPERACILLIN-TAZOBACTAM 3.375 G IVPB
3.3750 g | Freq: Three times a day (TID) | INTRAVENOUS | Status: DC
  Filled 2016-04-01: qty 50

## 2016-04-01 MED ORDER — VANCOMYCIN 50 MG/ML ORAL SOLUTION
125.0000 mg | Freq: Four times a day (QID) | ORAL | Status: DC
  Administered 2016-04-01 – 2016-04-04 (×12): 125 mg via ORAL
  Filled 2016-04-01 (×14): qty 2.5

## 2016-04-01 MED ORDER — LOPERAMIDE HCL 2 MG PO CAPS
2.0000 mg | ORAL_CAPSULE | Freq: Four times a day (QID) | ORAL | Status: DC | PRN
  Administered 2016-04-02: 2 mg via ORAL
  Filled 2016-04-01 (×2): qty 1

## 2016-04-01 MED ORDER — SODIUM POLYSTYRENE SULFONATE 15 GM/60ML PO SUSP: 30.0000 g | Freq: Once | ORAL | Status: DC

## 2016-04-01 MED ORDER — GERHARDT'S BUTT CREAM
TOPICAL_CREAM | Freq: Four times a day (QID) | CUTANEOUS | Status: DC
  Administered 2016-04-01 (×2): via TOPICAL
  Administered 2016-04-01: 1 via TOPICAL
  Administered 2016-04-02 (×2): via TOPICAL
  Administered 2016-04-02: 1 via TOPICAL
  Administered 2016-04-02 – 2016-04-04 (×7): via TOPICAL
  Administered 2016-04-04: 1 via TOPICAL
  Administered 2016-04-04 – 2016-04-05 (×5): via TOPICAL
  Administered 2016-04-06: 1 via TOPICAL
  Administered 2016-04-06 – 2016-04-07 (×6): via TOPICAL
  Administered 2016-04-07 – 2016-04-08 (×5): 1 via TOPICAL
  Administered 2016-04-09: 15:00:00 via TOPICAL
  Administered 2016-04-09: 1 via TOPICAL
  Administered 2016-04-09 – 2016-04-10 (×2): via TOPICAL
  Administered 2016-04-10 – 2016-04-11 (×4): 1 via TOPICAL
  Administered 2016-04-11: 09:00:00 via TOPICAL
  Filled 2016-04-01 (×4): qty 1

## 2016-04-01 MED ORDER — CHOLESTYRAMINE 4 G PO PACK
4.0000 g | PACK | ORAL | Status: DC
  Administered 2016-04-01 (×2): 4 g
  Filled 2016-04-01 (×4): qty 1

## 2016-04-01 MED ORDER — CHOLESTYRAMINE 4 G PO PACK
8.0000 g | PACK | ORAL | Status: DC
  Administered 2016-04-01 – 2016-04-09 (×14): 8 g
  Filled 2016-04-01 (×18): qty 2

## 2016-04-01 MED ORDER — DEXAMETHASONE 4 MG PO TABS
4.0000 mg | ORAL_TABLET | Freq: Every day | ORAL | Status: DC
  Administered 2016-04-01 – 2016-04-03 (×3): 4 mg
  Filled 2016-04-01 (×4): qty 1

## 2016-04-01 MED ORDER — BUSPIRONE HCL 5 MG PO TABS
10.0000 mg | ORAL_TABLET | Freq: Three times a day (TID) | ORAL | Status: DC
  Administered 2016-04-01 – 2016-04-11 (×29): 10 mg
  Filled 2016-04-01: qty 1
  Filled 2016-04-01: qty 2
  Filled 2016-04-01: qty 1
  Filled 2016-04-01: qty 2
  Filled 2016-04-01: qty 1
  Filled 2016-04-01: qty 2
  Filled 2016-04-01 (×15): qty 1
  Filled 2016-04-01: qty 2
  Filled 2016-04-01 (×5): qty 1
  Filled 2016-04-01 (×2): qty 2

## 2016-04-01 MED ORDER — MIRTAZAPINE 15 MG PO TABS
15.0000 mg | ORAL_TABLET | Freq: Every day | ORAL | Status: DC
  Administered 2016-04-01 – 2016-04-10 (×9): 15 mg
  Filled 2016-04-01 (×9): qty 1

## 2016-04-01 MED ORDER — IPRATROPIUM-ALBUTEROL 0.5-2.5 (3) MG/3ML IN SOLN
3.0000 mL | Freq: Three times a day (TID) | RESPIRATORY_TRACT | Status: DC
  Administered 2016-04-02 – 2016-04-11 (×27): 3 mL via RESPIRATORY_TRACT
  Filled 2016-04-01 (×27): qty 3

## 2016-04-01 MED ORDER — INSULIN ASPART 100 UNIT/ML ~~LOC~~ SOLN
0.0000 [IU] | SUBCUTANEOUS | Status: DC
  Administered 2016-04-01: 2 [IU] via SUBCUTANEOUS
  Administered 2016-04-02: 8 [IU] via SUBCUTANEOUS
  Administered 2016-04-02: 2 [IU] via SUBCUTANEOUS
  Administered 2016-04-02: 15 [IU] via SUBCUTANEOUS
  Administered 2016-04-02: 5 [IU] via SUBCUTANEOUS

## 2016-04-01 MED ORDER — IPRATROPIUM-ALBUTEROL 0.5-2.5 (3) MG/3ML IN SOLN
3.0000 mL | Freq: Four times a day (QID) | RESPIRATORY_TRACT | Status: DC
Start: 2016-04-01 — End: 2016-04-01
  Administered 2016-04-01 (×4): 3 mL via RESPIRATORY_TRACT
  Filled 2016-04-01 (×3): qty 3

## 2016-04-01 MED ORDER — INSULIN GLARGINE 100 UNIT/ML ~~LOC~~ SOLN
10.0000 [IU] | Freq: Every day | SUBCUTANEOUS | Status: DC
  Administered 2016-04-01: 10 [IU] via SUBCUTANEOUS
  Filled 2016-04-01: qty 0.1

## 2016-04-01 MED ORDER — LORAZEPAM 0.5 MG PO TABS
0.5000 mg | ORAL_TABLET | Freq: Three times a day (TID) | ORAL | Status: DC | PRN
  Administered 2016-04-03 – 2016-04-06 (×2): 0.5 mg
  Filled 2016-04-01 (×3): qty 1

## 2016-04-01 MED ORDER — CHLORHEXIDINE GLUCONATE 0.12 % MT SOLN
15.0000 mL | Freq: Two times a day (BID) | OROMUCOSAL | Status: DC
  Administered 2016-04-01 (×2): 15 mL via OROMUCOSAL

## 2016-04-01 MED ORDER — TRAMADOL HCL 50 MG PO TABS
25.0000 mg | ORAL_TABLET | Freq: Two times a day (BID) | ORAL | Status: DC
  Administered 2016-04-01 – 2016-04-11 (×20): 25 mg
  Filled 2016-04-01 (×20): qty 1

## 2016-04-01 MED ORDER — ENOXAPARIN SODIUM 40 MG/0.4ML ~~LOC~~ SOLN
40.0000 mg | SUBCUTANEOUS | Status: DC
Start: 2016-04-01 — End: 2016-04-02
  Administered 2016-04-01: 40 mg via SUBCUTANEOUS

## 2016-04-01 MED ORDER — FLUCONAZOLE 200 MG PO TABS
200.0000 mg | ORAL_TABLET | Freq: Every day | ORAL | Status: DC
  Administered 2016-04-01 – 2016-04-02 (×2): 200 mg
  Filled 2016-04-01 (×2): qty 1

## 2016-04-01 MED ORDER — GLUCERNA 1.2 CAL PO LIQD
1000.0000 mL | ORAL | Status: DC
  Administered 2016-04-01 – 2016-04-02 (×2): 1000 mL
  Filled 2016-04-01 (×3): qty 1000

## 2016-04-01 MED ORDER — FREE WATER
100.0000 mL | Freq: Three times a day (TID) | Status: DC
  Administered 2016-04-01 – 2016-04-11 (×26): 100 mL

## 2016-04-01 NOTE — Progress Notes (Signed)
Pharmacy Antibiotic Note  Adrian BlackwaterCatherine Matthies is a 82 y.o. female admitted on 03/31/2016 with pneumonia.  Pharmacy has been consulted for Zosyn dosing.  Plan: Zosyn 3.375g IV q8h (4 hour infusion).   Temp (24hrs), Avg:98.4 F (36.9 C), Min:98.3 F (36.8 C), Max:98.5 F (36.9 C)   Recent Labs Lab 03/31/16 2039 03/31/16 2052 04/01/16 0036 04/01/16 0044  WBC 18.6*  --   --   --   CREATININE 0.70 0.60 0.85  --   LATICACIDVEN  --  1.70  --  2.44*     Allergies  Allergen Reactions  . Tape Itching     Thank you for allowing pharmacy to be a part of this patient's care.  Vernard GamblesVeronda Allesandra Huebsch, PharmD, BCPS  04/01/2016 1:54 AM

## 2016-04-01 NOTE — ED Notes (Signed)
Attempted report x 1 to 2S

## 2016-04-01 NOTE — Progress Notes (Signed)
RT called and spoke with RN at [facility] and was told patient's vent settings are as follows: VT 450, RR 24, FIO2 40%, and a peep of 5.

## 2016-04-01 NOTE — Progress Notes (Signed)
Subjective:  Patient is somnolent but arouses to voice when addressed. She is able to shake her head no to being in discomfort, but quickly falls asleep again.   We were able to get in contact with nursing at [facility] facility. They report that patient had arrived at their facility in September after prolonged hospital stay that left her vent-dependent. She had been more somnolent and less interactive the last few days compared to her usual and having some diarrhea. UA showed pyuria and yeast - received diflucan; C dif testing positive - was started on vanc per G tube; CXR showed lower lobe consolidation - patient was set to be started on ertapenem, however lost IV access so they placed PICC line. Before able to administer abx, her labs showed hyperkalemia and [facility].   Patient has son who is apparently very attentive and involved in patient's care.  [facility] will fax over her records later this afternoon as well as provide contact info for family.   Objective:  Vital signs in last 24 hours: Vitals:   04/01/16 1100 04/01/16 1200 04/01/16 1206 04/01/16 1251  BP: 129/78 124/72 124/72   Pulse: (!) 107 (!) 103 98   Resp: (!) 26 (!) 31 (!) 25   Temp:    97.5 F (36.4 C)  TempSrc:      SpO2: 97% 100% 100%    Constitutional: NAD, somnolent but arousable CV: RRR, no murmurs, rubs or gallops appreciated, no LE edema, pulses intact Resp: trach in place with sputum and secretions present; mechanical breath sounds present bilaterally, no crackles or wheezing Vent settings: PRVC, FiO2 40%, RR 16, TV 400, peep 5 Abd: G tube in place, soft, +BS, NDNT GU: Foley cath in place Neuro: Opens eyes and appears alert, otherwise unable to assess.  Assessment/Plan:  Principal Problem:   Hyperkalemia Active Problems:   Anemia   Pressure injury of skin   HCAP (healthcare-associated pneumonia)   Hyperglycemia   Hyponatremia  Ventilator associated pneumonia: Patient with reported  newly diagnosed pneumonia at [facility]; she was supposed to be started on Ertapenem but had not been started yet before transfer to the ED; she had just had a PICC placed day of admission. CXR on admission shows left sided opacification that could represent pneumonia or scarring. She does have sputum production through her trach; sputum gram stain shows gram neg rods and gram po cocci in pairs. With that info and uncertain xray we are going to treat empirically as VAP with Cefepime. No coverage for MRSA indicated at this time as CXR findings are not consistent with MRSA pneumonia. She continues to be afebrile. --f/u blood cultures --f/u sputum cultures --IV cefepime --duonebs q6hrs  C difficile:  Per nursing at [facility], patient has been having diarrhea and tested positive for C. diff though it is uncertain if testing was positive for toxin or just gene. We will empirically treat with PO vanc through G tube as complicated C diff in setting of multiple comorbidities and low albumin. --f/u [facility] records for C diff results --PO vanc 125mg  QID per G tube  Complicated UTI: Patient with chronic indwelling catheter. Per nursing at [facility], patient was found to have yeast on recent UA and had received dosing of diflucan.  --change out foley cath --obtain UA and urine cultures  Hyperkalemia:  K on admission 6.4 and repeat testing fluctuating between normal and high within hours, which could be due to lab draw errors as other electrolytes had fluctuant values  that could not be accounted for. Repeat blood work this afternoon shows K 4.8. EKG's have not shown peaked T waves concerning for cardiac toxicity.  --f/u AM BMP --kayaxelate only if hyperkalemic >6 or hyperkalemic with EKG changes as can aggravate colonic inflammation in setting of concurrent C diff infection  Chronic anemia: On admission, Hgb was 6.4. After 1U pRBC, repeat labs show Hgb 10.5-11.5. Likely another lab draw error. Patient has no signs  of bleeding and anemia is likely chronic in setting of her multiple severe comorbidities. Will continue to monitor.  --f/u AM CBC  T2DM: -- Lantus 10 mg QHS -- SSI  HLD: -- Continue home Cholestyramine 4 mg QID per tube  GERD: -- Continue home pepcid 20 mg per tube daily   Anxiety: -- Continue home Buspar -- Continue home ativan 0.5 q8hrs prn per tube -- Continue home remeron 15 mg QHS  FEN: tube feeds per G tube, free water 100ml per G tube,  IVF NS 19025ml/hr x10hrs CODE: DNR  Dispo: Anticipated discharge in approximately 4-5 day(s).   Nyra MarketGorica Manar Smalling, MD 04/01/2016, 1:05 PM Pager [phone]

## 2016-04-01 NOTE — Consult Note (Signed)
WOC Nurse wound consult note Reason for Consult:midline abdominal wound (Chronic, non-healing).  Bedside RN mentions that son has relayed a medical history consisting of a hernia repair that never healed). Also noted is a denuded perineal area.  RN has already ordered Dr. Dirk DressGerhart's Butt cream with physician input.  This compounded product is a 1:1:1 mixture of a skin protectant (Zinc oxide), an antifungal (Lotrimin) and a steroid (hydrocortisone) Wound type: chronic, non-healing surgical wound and irritant dermatitis (perineum). Bilateral heels are intact. Pressure Ulcer POA: No Measurement:Midline wound measures 7.2cm x 5.4cm x 0.4cm Wound ZOX:WRUEbed:pink, smooth with no granulating tissue.  Closed wound edges. Drainage (amount, consistency, odor) Thick, clear drainage of moderate amount.  It is not clear when dressing was changed last. Periwound:Intact. Dressing procedure/placement/frequency: I will provide pressure redistribution heel boots and guidance for Nursing via the orders for turning and repositioning.  As the topical care for the perineum/perirectal area is in place, I have provided orders for abdominal wound (saline moistened gauze changed twice daily and as needed for drainage strike-through). WOC nursing team will not follow, but will remain available to this patient, the nursing, surgical and medical teams.  Please re-consult if needed. Thanks, Ladona MowLaurie Camya Haydon, MSN, RN, GNP, Hans EdenCWOCN, CWON-AP, FAAN  Pager# [phone]

## 2016-04-01 NOTE — ED Notes (Signed)
Spoke with Dr. Oswaldo DoneVincent to report pt lab results (hgb, K, trop, etc) per Dr. Oswaldo DoneVincent to hold 2nd unit of blood at this time.

## 2016-04-01 NOTE — Progress Notes (Signed)
Pharmacy Antibiotic Note  Janice BlackwaterCatherine Watson is a 82 y.o. female admitted on 03/31/2016 with pneumonia.  Pharmacy has been consulted for Cefepime dosing.  She was sent from [facility] with pneumonia. She was started on Zosyn and a request is received from the IMTS to change her to Cefepime. I noticed she had Ertapenem on her prior to admission medication list. Per discussion with the [facility]MT[facility] resident this was ordered empirically for pneumonia on 11/18 and she never actually received a dose prior to transfer. They report that she had no positive micro results at [facility] that prompted this choice.  She does have an Enterobacter cloacae isolated in May 2017 that is sensitive to Cefepime, Quinolones, Meropenem, Aminoglycosides, and Trimethoprim/sulfamethoxazole. Zosyn is not included in this susceptibility report.  Plan: Cefepime 1g IV q12h Follow renal function Follow available micro data If she clinically deteriorates would consider changing to Meropenem.     Temp (24hrs), Avg:98 F (36.7 C), Min:97.5 F (36.4 C), Max:98.5 F (36.9 C)   Recent Labs Lab 03/31/16 2039 03/31/16 2052 04/01/16 0036 04/01/16 0044 04/01/16 0350  WBC 18.6*  --   --   --  10.2  CREATININE 0.70 0.60 0.85  --  0.79  LATICACIDVEN  --  1.70  --  2.44*  --     CrCl cannot be calculated (Unknown ideal weight.).    Allergies  Allergen Reactions  . Tape Itching     Thank you for allowing pharmacy to be a part of this patient's care.  Estella HuskMichelle Conard Alvira, Pharm.D., BCPS, AAHIVP Clinical Pharmacist Phone: [phone] or [phone] 04/01/2016, 11:29 AM

## 2016-04-01 NOTE — ED Notes (Signed)
Attempted report x 2 to 2S

## 2016-04-01 NOTE — Progress Notes (Signed)
SLP Cancellation Note  Patient Details Name: Janice Watson MRN: [id number] DOB: Aug 29, 1931   Cancelled treatment:       Reason Eval/Treat Not Completed: Medical issues which prohibited therapy. Patient on vent. Notes indicate patient is chronically on full vel support and with a G-tube. MD, please clarify order for swallow evaluation. If swallow evaluation on vent is indicated, would recommend FEES.   Ferdinand LangoLeah Darean Rote MA, CCC-SLP [phone]  [name] Meryl 04/01/2016, 1:16 PM

## 2016-04-02 DIAGNOSIS — Z9911 Dependence on respirator [ventilator] status: Secondary | ICD-10-CM

## 2016-04-02 DIAGNOSIS — B961 Klebsiella pneumoniae [K. pneumoniae] as the cause of diseases classified elsewhere: Secondary | ICD-10-CM

## 2016-04-02 DIAGNOSIS — B9689 Other specified bacterial agents as the cause of diseases classified elsewhere: Secondary | ICD-10-CM

## 2016-04-02 DIAGNOSIS — N390 Urinary tract infection, site not specified: Secondary | ICD-10-CM

## 2016-04-02 LAB — GLUCOSE, CAPILLARY
GLUCOSE-CAPILLARY: 189 mg/dL — AB (ref 65–99)
GLUCOSE-CAPILLARY: 259 mg/dL — AB (ref 65–99)
GLUCOSE-CAPILLARY: 280 mg/dL — AB (ref 65–99)
GLUCOSE-CAPILLARY: 383 mg/dL — AB (ref 65–99)
Glucose-Capillary: 227 mg/dL — ABNORMAL HIGH (ref 65–99)
Glucose-Capillary: 220 mg/dL — ABNORMAL HIGH (ref 65–99)

## 2016-04-02 LAB — CBC
HCT: 30.5 % — ABNORMAL LOW (ref 36.0–46.0)
HEMOGLOBIN: 9.8 g/dL — AB (ref 12.0–15.0)
MCH: 29.3 pg (ref 26.0–34.0)
MCHC: 32.1 g/dL (ref 30.0–36.0)
MCV: 91 fL (ref 78.0–100.0)
Platelets: 143 10*3/uL — ABNORMAL LOW (ref 150–400)
RBC: 3.35 MIL/uL — ABNORMAL LOW (ref 3.87–5.11)
RDW: 16.4 % — AB (ref 11.5–15.5)
WBC: 8.8 10*3/uL (ref 4.0–10.5)

## 2016-04-02 LAB — CLOSTRIDIUM DIFFICILE BY PCR: Toxigenic C. Difficile by PCR: POSITIVE — AB

## 2016-04-02 LAB — OCCULT BLOOD X 1 CARD TO LAB, STOOL: Fecal Occult Bld: NEGATIVE

## 2016-04-02 LAB — BASIC METABOLIC PANEL
ANION GAP: 6 (ref 5–15)
BUN: 39 mg/dL — AB (ref 6–20)
CALCIUM: 8.2 mg/dL — AB (ref 8.9–10.3)
CO2: 22 mmol/L (ref 22–32)
Chloride: 103 mmol/L (ref 101–111)
Creatinine, Ser: 0.65 mg/dL (ref 0.44–1.00)
GFR calc Af Amer: >60 mL/min (ref >60)
GFR calc non Af Amer: >60 mL/min (ref >60)
GLUCOSE: 236 mg/dL — AB (ref 65–99)
Potassium: 5.5 mmol/L — ABNORMAL HIGH (ref 3.5–5.1)
Sodium: 131 mmol/L — ABNORMAL LOW (ref 135–145)

## 2016-04-02 LAB — URINE CULTURE

## 2016-04-02 LAB — ACTH STIMULATION, 3 TIME POINTS
Cortisol, 30 Min: 24.2 ug/dL
Cortisol, 60 Min: 25.4 ug/dL
Cortisol, Base: 9.6 ug/dL

## 2016-04-02 LAB — CORTISOL: CORTISOL PLASMA: 11.4 ug/dL

## 2016-04-02 LAB — C DIFFICILE QUICK SCREEN W PCR REFLEX
C Diff antigen: POSITIVE — AB
C Diff toxin: NEGATIVE

## 2016-04-02 MED ORDER — SODIUM CHLORIDE 0.9% FLUSH: 10.0000 mL | INTRAVENOUS | Status: DC | PRN

## 2016-04-02 MED ORDER — ORAL CARE MOUTH RINSE
15.0000 mL | Freq: Four times a day (QID) | OROMUCOSAL | Status: DC
  Administered 2016-04-02: 15 mL via OROMUCOSAL

## 2016-04-02 MED ORDER — CHLORHEXIDINE GLUCONATE 0.12% ORAL RINSE (MEDLINE KIT): 15.0000 mL | Freq: Two times a day (BID) | OROMUCOSAL | Status: DC

## 2016-04-02 MED ORDER — INSULIN GLARGINE 100 UNIT/ML ~~LOC~~ SOLN
15.0000 [IU] | Freq: Every day | SUBCUTANEOUS | Status: DC
  Administered 2016-04-02 – 2016-04-04 (×3): 15 [IU] via SUBCUTANEOUS
  Filled 2016-04-02 (×3): qty 0.15

## 2016-04-02 MED ORDER — INSULIN ASPART 100 UNIT/ML ~~LOC~~ SOLN: 0.0000 [IU] | Freq: Three times a day (TID) | SUBCUTANEOUS | Status: DC

## 2016-04-02 MED ORDER — COSYNTROPIN 0.25 MG IJ SOLR
0.2500 mg | Freq: Once | INTRAMUSCULAR | Status: AC
  Administered 2016-04-02: 0.25 mg via INTRAVENOUS
  Filled 2016-04-02: qty 0.25

## 2016-04-02 MED ORDER — ORAL CARE MOUTH RINSE
15.0000 mL | Freq: Four times a day (QID) | OROMUCOSAL | Status: DC
  Administered 2016-04-02 – 2016-04-11 (×33): 15 mL via OROMUCOSAL

## 2016-04-02 MED ORDER — INSULIN ASPART 100 UNIT/ML ~~LOC~~ SOLN
0.0000 [IU] | SUBCUTANEOUS | Status: DC
  Administered 2016-04-02: 7 [IU] via SUBCUTANEOUS
  Administered 2016-04-02: 11 [IU] via SUBCUTANEOUS
  Administered 2016-04-03: 4 [IU] via SUBCUTANEOUS
  Administered 2016-04-03: 2 [IU] via SUBCUTANEOUS
  Administered 2016-04-03: 3 [IU] via SUBCUTANEOUS
  Administered 2016-04-03 (×2): 7 [IU] via SUBCUTANEOUS
  Administered 2016-04-03 – 2016-04-04 (×2): 4 [IU] via SUBCUTANEOUS
  Administered 2016-04-04 (×2): 3 [IU] via SUBCUTANEOUS

## 2016-04-02 MED ORDER — SODIUM CHLORIDE 0.45 % IV SOLN
INTRAVENOUS | Status: DC
  Administered 2016-04-02 – 2016-04-05 (×5): via INTRAVENOUS

## 2016-04-02 MED ORDER — CHLORHEXIDINE GLUCONATE 0.12% ORAL RINSE (MEDLINE KIT)
15.0000 mL | Freq: Two times a day (BID) | OROMUCOSAL | Status: DC
  Administered 2016-04-02 – 2016-04-11 (×19): 15 mL via OROMUCOSAL

## 2016-04-02 MED ORDER — INSULIN GLARGINE 100 UNIT/ML ~~LOC~~ SOLN
12.0000 [IU] | Freq: Every day | SUBCUTANEOUS | Status: DC
  Filled 2016-04-02: qty 0.12

## 2016-04-02 MED ORDER — SODIUM CHLORIDE 0.9% FLUSH
10.0000 mL | Freq: Two times a day (BID) | INTRAVENOUS | Status: DC
  Administered 2016-04-02: 20 mL
  Administered 2016-04-02: 10 mL
  Administered 2016-04-03: 40 mL
  Administered 2016-04-03: 20 mL
  Administered 2016-04-04: 10 mL
  Administered 2016-04-04 – 2016-04-05 (×2): 40 mL
  Administered 2016-04-05 – 2016-04-07 (×2): 10 mL

## 2016-04-02 MED ORDER — FONDAPARINUX SODIUM 2.5 MG/0.5ML ~~LOC~~ SOLN
2.5000 mg | Freq: Every day | SUBCUTANEOUS | Status: DC
  Administered 2016-04-02 – 2016-04-04 (×3): 2.5 mg via SUBCUTANEOUS
  Filled 2016-04-02 (×3): qty 0.5

## 2016-04-02 MED ORDER — DEXTROSE-NACL 5-0.45 % IV SOLN
INTRAVENOUS | Status: DC
  Administered 2016-04-02: 08:00:00 via INTRAVENOUS

## 2016-04-02 NOTE — Progress Notes (Signed)
   Subjective:  No acute events overnight. Patient indicates she has abdominal discomfort, and some shortness of breath.   We have not received records from [facility] yet; have contacted [facility] inpatient where patient was apparently before transfer to nursing home. They have kindly agreed to send what information they have as well as coordinate with the nursing facility for records.   Objective:  Vital signs in last 24 hours: Vitals:   04/02/16 1113 04/02/16 1136 04/02/16 1200 04/02/16 1225  BP:   114/67 114/67  Pulse:   92 87  Resp:   (!) 28 (!) 28  Temp:  98 F (36.7 C)    TempSrc:  Oral    SpO2:   100% 100%  Weight:      Height: 5\' 1"  (1.549 m)      Constitutional: NAD, VS reviewed CV: RRR, no murmurs, rubs or gallops appreciated, no LE edema, pulses intact Resp: trach in place with sputum and secretions present; mechanical breath sounds present bilaterally, no crackles or wheezing Vent settings: PRVC, FiO2 40%, RR 16, TV 400, peep 5 Abd: G tube in place, soft, +BS, tender over G tube site, some erythema and induration over in surround area of G tube GU: Foley cath in place Neuro: Opens eyes and appears alert, grip strength intact bilaterally, symmetrical smile, able to follow simple commands.  Assessment/Plan:  Principal Problem:   Hyperkalemia Active Problems:   Anemia   Pressure injury of skin   Ventilator associated pneumonia (HCC)   Hyperglycemia   Hyponatremia  Ventilator associated pneumonia: CXR suggestive of pneumonia, sputum gram stain showing G- rods, G+ rods, G+ cocci in pairs. Continued on Cefepime for now.  --f/u blood cultures no growth x1d --f/u sputum cultures --IV cefepime, day 2 --duonebs q6hrs  C difficile:  C dif testing showed +antigen, - toxin, and +PCR. Will continue to treat with vanc per G-tube --PO vanc 125mg  QID per G tube  Complicated UTI: Patient with chronic indwelling catheter. Per nursing at [facility], patient was found to have  yeast on recent UA and had received dosing of diflucan.  --UA +RBC's and WBC's, few bacteria; likely 2/2 change in cath and contamination.  --d/c diflucan as yeast colonization is expected with chronic indwelling catheter.  Hyperkalemia:  K of 5.5 this AM with unremarkable EKG compared to previous. Patient does not have renal dysfunction; heparin rarely can cause hyperkalemia so will switch DVT prophylaxis to fondaparinux. Patient on dexamethasone presumably for adrenal insufficiency; will obtain ACTH stim test to determine if need for additional treatment is necessary.  --f/u AM BMP --f/u ACTH stim test  Chronic anemia: Hgb 9.8 this AM. FOBT was negative, UA was positive for RBC's likely due to trauma as foley was just changed prior to study. Will continue to monitor.  --f/u AM CBC  T2DM: Appreciate diabetes coordinator assistance. We will attempt adjusting subq insulin regimen before resorting to IV insulin at this time. -- Lantus 15 -- SSI - R  HLD: -- Continue home Cholestyramine 4 mg QID per tube  GERD: -- Continue home pepcid 20 mg per tube daily   Anxiety: -- Continue home Buspar -- Continue home ativan 0.5 q8hrs prn per tube -- Continue home remeron 15 mg QHS  FEN: tube feeds per G tube, free water 100ml per G tube,  IVF NS 12525ml/hr x10hrs CODE: DNR DVT PPx: fondaparinux   Dispo: Anticipated discharge in approximately 4-5 day(s).   Janice MarketGorica Madoc Holquin, MD 04/02/2016, 1:24 PM Pager [phone]

## 2016-04-02 NOTE — Progress Notes (Addendum)
Inpatient Diabetes Program Recommendations  AACE/ADA: New Consensus Statement on Inpatient Glycemic Control (2015)  Target Ranges:  Prepandial:   less than 140 mg/dL      Peak postprandial:   less than 180 mg/dL (1-2 hours)      Critically ill patients:  140 - 180 mg/dL   Results for Janice BlackwaterSHURTLEFF, Greidy (MRN [id number]) as of 04/02/2016 11:43  Ref. Range 04/01/2016 12:49 04/01/2016 16:47 04/01/2016 20:17 04/01/2016 23:55 04/02/2016 04:09 04/02/2016 08:19 04/02/2016 11:39  Glucose-Capillary Latest Ref Range: 65 - 99 mg/dL 696292 (H) 295162 (H) 284150 (H) 189 (H) 227 (H) 280 (H) 383 (H)    Review of Glycemic Control  Diabetes history:       DM2, steroids this admission Outpatient Diabetes medications:       Lantus 10 units QHS, Humalog 0-10 units Q6H and 10 units dialy Current orders for Inpatient glycemic control:       Lantus 12 units QHS, Novolog 0-15 units Q4H, TF's  Inpatient Diabetes Program Recommendations:      Please consider Adult ICU Glycemic Control Phase 2 order set and discontinuing current insulin orders.      Per ADA recommendations "consider performing an A1C on all patients with diabetes or hyperglycemia admitted to the hospital if not performed in the prior 3 months".  Note: text paged Resident.  Thank you,  Kristine LineaKaren Charnese Federici, RN, BSN Diabetes Coordinator Inpatient Diabetes Program [phone] (Team Pager)

## 2016-04-02 NOTE — Significant Event (Signed)
Patient skin is excoriated and raw-bleeding in perirectal from incontinence of loose stools. RN spoke with primary MD regarding this. Received order for flexiseal to protect skin. Flexiseal placed without complications-has 35cc of sterile water in balloon. Gerheardt's cream and barrier applied around flexiseal and all over sacrum/buttocks. Patient is repositioned to her side.

## 2016-04-02 NOTE — Care Management Note (Signed)
Case Management Note  Patient Details  Name: Adrian BlackwaterCatherine Norgaard MRN: [id number] Date of Birth: January 05, 1932  Subjective/Objective:    Pt admitted with  Hyperkalemia and change in mental status        Action/Plan:   PTA from [facility] or SNF.  CM has placed call to [facility] resource to determine which part of facility pt resides in.  [place] [facility] [place] of this [place].  Pt has a son that is active in pts care.  CM will continue to follow for discharge needs   Expected Discharge Date:                  Expected Discharge Plan:  Long Term Acute Care (LTAC)  In-House Referral:     Discharge planning Services  CM Consult  Post Acute Care Choice:    Choice offered to:     DME Arranged:    DME Agency:     HH Arranged:    HH Agency:     Status of Service:  In process, will continue to follow  If discussed at Long Length of Stay Meetings, dates discussed:    Additional Comments:  [name], Mishawn Didion S, RN 04/02/2016, 10:36 AM

## 2016-04-02 NOTE — Progress Notes (Signed)
Medicine attending: I personally examined this patient today and reviewed all pertinent clinical, x-ray, and laboratory data. I concur with evaluation and management plan as recorded by resident physician Dr. Nyra MarketGorica Svalina. Complex case with gaps in her prehospital database. We are in the process of obtaining outside records. She [facility]. She is respirator dependent. She can only communicate with yes or no answers. It appears that she was hospitalized in [place] in May of this year for a ventral hernia repair. Apparently she had a cardiopulmonary arrest. She required prolonged ventilatory support and has not been able to be weaned off the ventilator. A feeding gastrostomy tube was placed. She has been recuperating at the long-term care facility since September. She has a chronic Foley catheter. 2 days prior to transfer here, she was noted to be more somnolent, less interactive. She developed diarrhea. Subsequent C. difficile positive. Started on vancomycin. Urine analysis with pyuria and yeast. She was started on fluconazole and a third-generation semisynthetic penicillin. Laboratory studies revealed hyponatremia and hyperkalemia with normal BUN and creatinine. On arrival in our emergency department, initial labs drawn from a central line likely spurious with hemoglobin 6.4. On repeat fingerstick value 12.6. Venipuncture sample 11. Initial white count 18,600 with 97% neutrophils. Repeat white count 10,200 and this morning 8800. Initial potassium 6. 2 repeat 6.4. Sodium 127, repeat 124. Bicarbonate 22. BUN 50. Creatinine 0.7. Repeat BUN 11, creatinine 0.85. Random glucose 481. Repeat 101. Lactic acid 1.7. Repeat 2.4. Pro-calcitonin 0.62. Chest x-ray is poor quality. No gross infiltrate in my opinion. Right PICC catheter noted. Mild bilateral atelectasis versus early pneumonia. She was given calcium gluconate and insulin. Potassium now in safe  range. Initial cardiogram did not show any toxic changes from the high potassium. Troponin 0.04, 0.05, 0.04.  Current exam: Blood pressure 124/63, pulse 88, temperature 98.1 F (36.7 C), temperature source Oral, resp. rate (!) 23, height 5\' 1"  (1.549 m), weight 117 lb 15.1 oz (53.5 kg), SpO2 100 %. She is alert and appears oriented. Responding appropriately to questions. She is on a ventilator. Distant cardiac sounds. Anterior chest overall clear. Abdomen is soft. G-tube left paramedian. Erythema and tenderness extending about 8 cm laterally. No exudate at entry site.  Impression: #1. Likely bacterial infection given leukocytosis and elevated pro-calcitonin. Site unclear. She is at risk for aspiration. There are no gross pulmonary infiltrates on current chest x-ray. Also at risk for urinary tract infection. Per phone conversation by Dr. Samuella CotaSvalina with nurse at the extended care facility, urine was growing Klebsiella. We will need to confirm this and see what the sensitivities are. Pending further information, she is currently being covered with Cefipime and vancomycin. We are going to stop the fluconazole. No suspicion that she has systemic fungal infection. We will likely be able to modify antibiotics further.  #2. Hyponatremia/hyperkalemia Decadron 4 mg daily listed as one of her chronic medications. Reason this was started his unclear. She has normal renal function. She is not on any potassium supplements. I'm concerned that these electrolyte abnormalities may represent adrenal insufficiency. We will do an ACTH stimulation test. This is still valid in people on Decadron. I doubt very much that she has primary hypoaldosteronism/type IV renal tubular acidosis.  #3. Ventilator dependence I think it would be worthwhile to get our pulmonary critical care physicians involved in her management.  For additional medical problems see Dr. Kandice HamsSvalina's note.

## 2016-04-02 NOTE — Progress Notes (Signed)
SLP Cancellation Note  Patient Details Name: Janice BlackwaterCatherine Watson MRN: [id number] DOB: May 16, 1931   Cancelled treatment:       Reason Eval/Treat Not Completed: Other (comment)  SLP services not warranted per discussion with teaching service.  Will sign off.    Blenda MountsCouture, [place] Sarinana Laurice 04/02/2016, 12:20 PM

## 2016-04-02 NOTE — Progress Notes (Signed)
Initial Nutrition Assessment  INTERVENTION:  Recommend providing Glucerna 1.2 @ 35 ml/hr via PEG with 30 ml Prostat BID.    Tube feeding regimen provides 1208 kcal (103% of needs), 80 grams of protein, and 676 ml of H2O.   Recommend providing 80 ml free water flush every 4 hours to provide additional  480 ml of water daily. TF plus FWFs will provide 1156 ml of fluid daily.   Provide liquid multivitamin via PEG daily  When diet is advanced, recommend continuing multivitamin and pro-stat via PEG. Wean TF rate as PO intake improves.   NUTRITION DIAGNOSIS:   Inadequate oral intake related to inability to eat as evidenced by NPO status.   GOAL:   Patient will meet greater than or equal to 90% of their needs   MONITOR:   TF tolerance, Vent status, Weight trends, I & O's, Skin, Labs  REASON FOR ASSESSMENT:   Ventilator    ASSESSMENT:   82 yo F ventilator dependent with a trach, pmhx of HTN, COPD, and GERD who [facility] reported [facility]kalemia. Patient is non verbal and only able to answer yes/no questions with a nod. She is alert to person, place, and time with yes/no questioning  Patient is currently intubated on ventilator support MV: 10 L/min Temp (24hrs), Avg:97.8 F (36.6 C), Min:97.4 F (36.3 C), Max:98.1 F (36.7 C)  Pt with trach and G-tube. Pt receiving Glucerna 1.2 tube feeding via PEG at 40 ml/hr at time of visit with 100 ml free water flush every 8 hours. This is providing 1152 kcal, 58 grams of protein, and 1072 ml of water. Pt alert at time of visit. Nods yes to having lost weight and mouthed that she used to weigh 145 lbs. She also mouths that she is 5'1". Pt has moderate muscle wasting of temples and thighs. She nods yes that she usually eats and usually receives Glucerna 1.2 via G-tube. Per H&P, pt receives tube feeds and dysphagia 1 diet. Based on patient's report of UBW of 145 lbs, she has lost 19% of her body weight. Suspect  moderate malnutrition, but unable to determine at this time due to limited history.   Labs: low hemoglobin, low sodium, elevated potassium, elevated glucose 150 to 391 mg/dL, low calcium, elevated BUN  Diet Order:   NPO  Skin:  Wound (see comment) (Stage II pressure ulcer on buttocks; non healing mid-abdominal wound)  Last BM:  11/20  Height:   Ht Readings from Last 1 Encounters:  04/02/16 5\' 1"  (1.549 m)    Weight:   Wt Readings from Last 1 Encounters:  04/02/16 117 lb 15.1 oz (53.5 kg)    Ideal Body Weight:  47.7 kg  BMI:  Body mass index is 22.29 kg/m.  Estimated Nutritional Needs:   Kcal:  1177  Protein:  80-90 grams  Fluid:  2 L/day  EDUCATION NEEDS:   No education needs identified at this time  Dorothea Ogleeanne Ashely Goosby RD, CSP, LDN Inpatient Clinical Dietitian Pager: [phone] After Hours Pager: [phone]

## 2016-04-03 ENCOUNTER — Inpatient Hospital Stay (HOSPITAL_COMMUNITY): Payer: Medicare Other

## 2016-04-03 DIAGNOSIS — A0471 Enterocolitis due to Clostridium difficile, recurrent: Secondary | ICD-10-CM

## 2016-04-03 DIAGNOSIS — E875 Hyperkalemia: Secondary | ICD-10-CM

## 2016-04-03 LAB — CBC
HCT: 30.5 % — ABNORMAL LOW (ref 36.0–46.0)
Hemoglobin: 9.6 g/dL — ABNORMAL LOW (ref 12.0–15.0)
MCH: 28.8 pg (ref 26.0–34.0)
MCHC: 31.5 g/dL (ref 30.0–36.0)
MCV: 91.6 fL (ref 78.0–100.0)
Platelets: 167 10*3/uL (ref 150–400)
RBC: 3.33 MIL/uL — ABNORMAL LOW (ref 3.87–5.11)
RDW: 16.4 % — ABNORMAL HIGH (ref 11.5–15.5)
WBC: 8.9 10*3/uL (ref 4.0–10.5)

## 2016-04-03 LAB — GLUCOSE, CAPILLARY
GLUCOSE-CAPILLARY: 93 mg/dL (ref 65–99)
GLUCOSE-CAPILLARY: 156 mg/dL — AB (ref 65–99)
GLUCOSE-CAPILLARY: 224 mg/dL — AB (ref 65–99)
Glucose-Capillary: 128 mg/dL — ABNORMAL HIGH (ref 65–99)
Glucose-Capillary: 142 mg/dL — ABNORMAL HIGH (ref 65–99)
Glucose-Capillary: 152 mg/dL — ABNORMAL HIGH (ref 65–99)
Glucose-Capillary: 218 mg/dL — ABNORMAL HIGH (ref 65–99)

## 2016-04-03 LAB — CULTURE, RESPIRATORY W GRAM STAIN

## 2016-04-03 LAB — BASIC METABOLIC PANEL
Anion gap: 5 (ref 5–15)
BUN: 26 mg/dL — ABNORMAL HIGH (ref 6–20)
CO2: 24 mmol/L (ref 22–32)
Calcium: 8.4 mg/dL — ABNORMAL LOW (ref 8.9–10.3)
Chloride: 106 mmol/L (ref 101–111)
Creatinine, Ser: 0.54 mg/dL (ref 0.44–1.00)
GFR calc Af Amer: >60 mL/min (ref >60)
GFR calc non Af Amer: >60 mL/min (ref >60)
Glucose, Bld: 130 mg/dL — ABNORMAL HIGH (ref 65–99)
Potassium: 4.8 mmol/L (ref 3.5–5.1)
Sodium: 135 mmol/L (ref 135–145)

## 2016-04-03 LAB — CULTURE, RESPIRATORY

## 2016-04-03 LAB — PROCALCITONIN: PROCALCITONIN: 0.33 ng/mL

## 2016-04-03 MED ORDER — ADULT MULTIVITAMIN LIQUID CH
15.0000 mL | Freq: Every day | ORAL | Status: DC
  Administered 2016-04-03 – 2016-04-11 (×9): 15 mL
  Filled 2016-04-03 (×9): qty 15

## 2016-04-03 MED ORDER — PRO-STAT SUGAR FREE PO LIQD
30.0000 mL | Freq: Two times a day (BID) | ORAL | Status: DC
  Administered 2016-04-03 – 2016-04-11 (×16): 30 mL
  Filled 2016-04-03 (×16): qty 30

## 2016-04-03 MED ORDER — GLUCERNA 1.2 CAL PO LIQD
1000.0000 mL | ORAL | Status: DC
  Administered 2016-04-03 – 2016-04-10 (×6): 1000 mL
  Filled 2016-04-03 (×10): qty 1000

## 2016-04-03 MED ORDER — LOPERAMIDE HCL 1 MG/5ML PO LIQD
2.0000 mg | Freq: Four times a day (QID) | ORAL | Status: DC | PRN
Start: 2016-04-03 — End: 2016-04-11
  Administered 2016-04-06 – 2016-04-09 (×4): 2 mg via ORAL
  Filled 2016-04-03 (×5): qty 10

## 2016-04-03 NOTE — Care Management Note (Addendum)
Case Management Note  Patient Details  Name: Janice BlackwaterCatherine Hartline MRN: [id number] Date of Birth: 1932-04-12  Subjective/Objective:    Pt admitted with  Hyperkalemia and change in mental status        Action/Plan:   PTA from [facility] or SNF.  CM has placed call to [facility] resource to determine which part of facility pt resides in.  Pt [place] [facility] [place] September of this year.  Pt has a son that is active in pts care.  CM will continue to follow for discharge needs   Expected Discharge Date:                  Expected Discharge Plan:  Long Term Acute Care (LTAC)  In-House Referral:     Discharge planning Services  CM Consult  Post Acute Care Choice:    Choice offered to:     DME Arranged:    DME Agency:     HH Arranged:    HH Agency:     Status of Service:  In process, will continue to follow  If discussed at Long Length of Stay Meetings, dates discussed:    Additional Comments: 04/03/2016    CM contacted by Dr Benjiman CoreSvlani - CM [facility] and restrictions with [facility] due to medicare days.  Dr acknowledged and discussed it with attending - service is not comfortable at this time with discharging pt due to they are actively treating pt and have been unable to obtain medical records from [facility] and prior facility.  CM contacted [facility] liaison to facilitate record transfer.  CM spoke with son [name] and shared LTACH recommendation and explained that [facility] can not offer pt bed at this time.  Son  is in agreement with [facility] when [facility].    CM has text paged attending service multiple times - CM text paged Dr Oswaldo DoneVincent and received call back - Dr Oswaldo DoneVincent will relay message to attending group.  CSW made aware of pt being from [facility] with tentative referral  CM confirmed that pt is from [facility] SNF - CM spoke with physician advisor and pt is actually appropriate for  LTACH. Referral made to both agencies - [facility] informed CM that agency can not offer pt a bed at this time - [facility] is able to offer pt bed on LTACH side.  CM contacted family to discuss LTACH as a discharge option, and also contacted attending to inquire about discharge readiness. [name], Dontay Harm S, RN 04/03/2016, 12:04 PM

## 2016-04-03 NOTE — Procedures (Signed)
Pt placed on PS trial at this time per MD, increased PS due to decreased VT's, pt tolerating 15/5 well, RT will monitor

## 2016-04-03 NOTE — Progress Notes (Addendum)
Pt came from [facility]. Per [facility] rep pt only has 27 medicare days left. She can go to [facility] now if needed. [facility] states on day to day basis due to each day here uses medicare days. Other ltac called and cannot offer bed. Only ltac bed will be [facility] at present.

## 2016-04-03 NOTE — Progress Notes (Signed)
Subjective:  Patient was found to have some skin excoriation in her perineal area likely secondary to diarrhea; a rectal tube was placed to protect the area from further insults.   Patient is awake, alert and attempts communication this morning. She mouthed when will she be able to go home (home being her SNF). She indicates she feels better. She still having some abdominal discomfort and shortness of breath but these are improved from yesterday.   Objective:  Vital signs in last 24 hours: Vitals:   04/03/16 0800 04/03/16 0849 04/03/16 0952 04/03/16 1000  BP: 129/62 129/62 (!) 115/56 104/88  Pulse:  92 90 74  Resp: (!) 23 (!) 22 (!) 28 (!) 25  Temp:      TempSrc:      SpO2:  97% 97% 98%  Weight:      Height:       Constitutional: NAD, VS reviewed CV: RRR, no murmurs, rubs or gallops appreciated, no LE edema, pulses intact Resp: trach in place with sputum and secretions present; mechanical breath sounds present bilaterally, with rhonchi, no wheezing Vent settings: PRVC, FiO2 40%, RR 16, TV 400, peep 5 Abd: G tube in place, soft, +BS, tender over G tube site, some erythema and induration over in surround area of G tube; wound at ventral hernia repair site with mild serosanguinous oozing - dressing in place; contact dermatitis surrounding wound site and near G-tube GU: Foley cath in place Neuro: Opens eyes and appears alert, grip strength intact bilaterally, symmetrical smile, able to follow commands; appears more alert today and attempting to communicate more.  Assessment/Plan:  Principal Problem:   Hyperkalemia Active Problems:   Anemia   Pressure injury of skin   Ventilator associated pneumonia (HCC)   Hyperglycemia   Hyponatremia   Ventilator dependent (HCC)   UTI due to Klebsiella species  Ventilator associated pneumonia: CXR suggestive of pneumonia but was not great study due to positioning; sputum gram stain showing G- rods, G+ rods, G+ cocci in pairs. Sputum culture  shows multiple organisms present with none predominant. Continued on Cefepime for now to cover UTI.    --f/u blood cultures no growth x2d --IV cefepime, day 3 --duonebs q6hrs --we have consulted pulmonary for assistance in assessing vent need and possible weaning  C difficile:  C dif testing showed +antigen, - toxin, and +PCR. Will continue to treat with vanc per G-tube.  --PO vanc 125mg  QID per G tube; will plan on extended taper 2wks post antibiotic dosing for UTI.   Complicated UTI: Patient with chronic indwelling catheter. Per chart review, patient has history of Klebsiella UTI and per nursing at [facility] had prelim urine culture at transfer showed K. Pneumonia and was about to be started on ertapenem for this infection. UA on admission showed RBC's WBC, and few bacteria which could be consistent with UTI - she was started on cefepime on admission. Initial urine culture had too many species growing so repeat was obtained; this is still pending but may not be accurate as patient had a few days of antibiotics already.   --f/u urine culture --continue cefepime until culture and sensitivities come back.  Hyperkalemia:  K this AM is 4.8. ACTH stim test was negative so will consider stopping Dexamethasone.  --f/u AM BMP    Chronic anemia: Hgb 9.6 this AM-stable. FOBT was negative, UA was positive for RBC's likely due to trauma as foley was just changed prior to study. Will continue to monitor.  --f/u AM CBC  T2DM: SBG this AM 130. Will continue with current regimen and adjust if steroids are discontinued.  -- Lantus 15 -- SSI - R  HLD: -- Continue home Cholestyramine 4 mg QID per tube  GERD: -- Continue home pepcid 20 mg per tube daily   Anxiety: -- Continue home Buspar -- Continue home ativan 0.5 q8hrs prn per tube -- Continue home remeron 15 mg QHS  FEN: tube feeds per G tube per nutrition, free water 100ml per G tube,  IVF NS 13800ml/hr CODE: DNR DVT PPx: fondaparinux    Dispo: Anticipated discharge in approximately 4-5 day(s).   Nyra MarketGorica Philena Obey, MD 04/03/2016, 11:33 AM Pager [phone]

## 2016-04-03 NOTE — Consult Note (Signed)
PULMONARY / CRITICAL CARE MEDICINE   Name: Janice Watson MRN: [id number] DOB: 1932/01/07    ADMISSION DATE:  03/31/2016 CONSULTATION DATE:  11/21  REFERRING MD:  TS  CHIEF COMPLAINT:  ?pneumonia  HISTORY OF PRESENT ILLNESS:   82 yo LTAC([facility] ) resident who is trach/vent dependent, DNR, and was admitted from [facility] ED with CC of pneumonia and hyperkalemia with preserved renal function. She has a PICC and G tube in place. Currently stable, VSS, and is awake and alert on full vent support. Being treated for pneumonia with Vanc/maxipime. She has recent hx of being trach dependent prior to transfer to [facility] from [place] in [place]. PCCM asked to help with vent.  PAST MEDICAL HISTORY :  She  has a past medical history of Anxiety; C. difficile colitis; Chronic anemia; COPD (chronic obstructive pulmonary disease) (HCC); GERD (gastroesophageal reflux disease); Healthcare-associated pneumonia; Hypertension; Neuropathic bladder; Pneumonia; Protein calorie malnutrition (HCC); Takotsubo cardiomyopathy; and Ventilator dependent (HCC).  PAST SURGICAL HISTORY: She  has no past surgical history on file.  Allergies  Allergen Reactions  . Tape Itching    No current facility-administered medications on file prior to encounter.    No current outpatient prescriptions on file prior to encounter.    FAMILY HISTORY:  Her has no family status information on file.    SOCIAL HISTORY:  REVIEW OF SYSTEMS:   NA  SUBJECTIVE:  NAD  VITAL SIGNS: BP 104/88   Pulse 74   Temp 98.1 F (36.7 C) (Oral)   Resp (!) 25   Ht 5\' 1"  (1.549 m) Comment: per patient  Wt 117 lb 15.1 oz (53.5 kg)   SpO2 98%   BMI 22.29 kg/m   HEMODYNAMICS:    VENTILATOR SETTINGS: Vent Mode: PRVC FiO2 (%):  [40 %] 40 % Set Rate:  [16 bmp] 16 bmp Vt Set:  [400 mL] 400 mL PEEP:  [5 cmH20] 5 cmH20 Plateau Pressure:  [12 cmH20-18 cmH20] 18 cmH20  INTAKE / OUTPUT: I/O last 3 completed shifts: In: 4651.7  [I.V.:2196.7; Other:605; NG/GT:1700; IV Piggyback:150] Out: 3400 [Urine:3400]  PHYSICAL EXAMINATION: General:  Frail female who is active Neuro:  Communicates non verbal HEENT: Trach in place Cardiovascular:  HSIR Lungs:  Decreased bs bases Abdomen:  Soft + bs Musculoskeletal:  intact Skin: warm and dry  LABS:  BMET  Recent Labs Lab 04/01/16 2200 04/02/16 0415 04/03/16 0420  NA 130* 131* 135  K 5.1 5.5* 4.8  CL 104 103 106  CO2 21* 22 24  BUN 42* 39* 26*  CREATININE 0.75 0.65 0.54  GLUCOSE 198* 236* 130*    Electrolytes  Recent Labs Lab 04/01/16 1053 04/01/16 2200 04/02/16 0415 04/03/16 0420  CALCIUM 8.6* 8.3* 8.2* 8.4*  MG 1.9  --   --   --     CBC  Recent Labs Lab 04/01/16 0350 04/02/16 0415 04/03/16 0420  WBC 10.2 8.8 8.9  HGB 11.0* 9.8* 9.6*  HCT 34.8* 30.5* 30.5*  PLT 176 143* 167    Coag's No results for input(s): APTT, INR in the last 168 hours.  Sepsis Markers  Recent Labs Lab 03/31/16 2052 04/01/16 0044 04/01/16 1053 04/03/16 0420  LATICACIDVEN 1.70 2.44*  --   --   PROCALCITON  --   --  0.62 0.33    ABG  Recent Labs Lab 03/31/16 2111  PHART 7.382  PCO2ART 38.1  PO2ART 88.0    Liver Enzymes  Recent Labs Lab 03/31/16 2039 04/01/16 1053  AST 22 21  ALT 26  24  ALKPHOS 100 98  BILITOT 0.3 0.4  ALBUMIN 1.9* 1.8*    Cardiac Enzymes  Recent Labs Lab 04/01/16 0350 04/01/16 1054  TROPONINI 0.05* 0.04*    Glucose  Recent Labs Lab 04/02/16 1632 04/02/16 2045 04/03/16 0026 04/03/16 0423 04/03/16 0800 04/03/16 1154  GLUCAP 259* 220* 218* 128* 93 142*    Imaging No results found.   STUDIES:    CULTURES: 11/20 uc>> 11/20 c diff>>++ 11/19 bc>> 11/19 sputum > mul sp  ANTIBIOTICS: 11/19 vanc>> 11/19 cefepime>>  SIGNIFICANT EVENTS: 11/18 tx to [facility]  LINES/TUBES: Rt picc>> #8 shiley XLT >> G tube>>  DISCUSSION: 82 yo LT[facility] ) resident who is trach/vent dependent, DNR, and was  ad[facility] with CC of pneumonia and hyperkalemia with preserved renal function. She has a PICC and G tube in place. Currently stable, VSS, and is awake and alert on full vent support. Being treated for pneumonia with Vanc/maxipime. She has recent hx of being trach dependent prior to transfer to [facility] from [facility] [place]. PCCM asked to help with vent.  ASSESSMENT / PLAN:  PULMONARY A: Trach and Vent dependent # 8 shiley xlt P:   Wean as able  CARDIOVASCULAR A:  Hx of Takotsubo CM Hx of HTN P:  Follow hd profile Consider 2 d echo but she is DNR and is HD stable currently  RENAL Lab Results  Component Value Date   CREATININE 0.54 04/03/2016   CREATININE 0.65 04/02/2016   CREATININE 0.75 04/01/2016    Recent Labs Lab 04/01/16 2200 04/02/16 0415 04/03/16 0420  K 5.1 5.5* 4.8     Recent Labs Lab 04/01/16 2200 04/02/16 0415 04/03/16 0420  NA 130* 131* 135     A:   No acute issues currently P:   Follow labs  GASTROINTESTINAL A:   GI protection P:   PPI  HEMATOLOGIC A:   Anemia P:  Transfuse per protocol  INFECTIOUS A:   ? pneumonia by c x r. No leukocytosis or fever P:   Vanc and cefepime per TS  ENDOCRINE CBG (last 3)   Recent Labs  04/03/16 0423 04/03/16 0800 04/03/16 1154  GLUCAP 128* 93 142*  Chronic steroid use with decadron    A:   DM P:   SSI per TS Follow cortisol level  NEUROLOGIC A:   No acute issue, follows commands, non verbal P:   RASS goal: 0 Minimum sedation    FAMILY  - Updates: none at bedside  - Inter-disciplinary family meet or Palliative Care meeting due by:  11/28  App CCT= 45 minutes  Brett CanalesSteve Minor ACNP [facility] PCCM Pager [phone] till 3 pm If no answer page [phone] 04/03/2016, 12:12 PM   Attending note: I have seen and examined the patient with nurse practitioner/resident and agree with the note. History, labs and imaging reviewed.  82 year old trach, vent dependent patient  admitted from [facility] with pneumonia, hyperkalemia. She has had a previous prolonged stay at an outside hospital with takotsubo cardiomyopathy, VAP, C. difficile colitis.  Continues on vanco for C diff positive stool test. Cefepime and vanco for PNA  We will place her on a PSv trial today and wean as tolerated  The patient is critically ill with multiple organ systems failure and requires high complexity decision making for assessment and support, frequent evaluation and titration of therapies, application of advanced monitoring technologies and extensive interpretation of multiple databases.  Critical care time - 35 mins. This represents my time independent  of the NPs time taking care of the pt.  Chilton GreathousePraveen Hasini Peachey MD [facility] Pager [phone] If no answer or after 3pm call: [phone] 04/03/2016, 5:39 PM

## 2016-04-03 NOTE — Progress Notes (Signed)
Trach care done per RRT. Site was cleansed with NS, dried, and dressing applied. New IC inserted and locked back into place. No distress or complications noted with trach care.

## 2016-04-03 NOTE — Progress Notes (Signed)
MEDICATION Related NOTE    Pharmacy Re:  Home medications  Medications:  Prescriptions Prior to Admission  Medication Sig Dispense Refill Last Dose  . busPIRone (BUSPAR) 10 MG tablet Place 10 mg into feeding tube 3 (three) times daily.     . chlorhexidine (PERIDEX) 0.12 % solution Use as directed 15 mLs in the mouth or throat 2 (two) times daily.     . cholestyramine (QUESTRAN) 4 g packet Place 4 g into feeding tube 4 (four) times daily.     Marland Kitchen. dexamethasone (DECADRON) 4 MG tablet Place 4 mg into feeding tube daily.     . ertapenem 1 g in sodium chloride 0.9 % 50 mL Inject 1 g into the vein daily.     . famotidine (PEPCID) 20 MG tablet Place 20 mg into feeding tube daily.     . fluconazole (DIFLUCAN) 200 MG tablet Place 200 mg into feeding tube daily.     . heparin 5000 UNIT/ML injection Inject 5,000 Units into the skin 2 (two) times daily.     . insulin glargine (LANTUS) 100 UNIT/ML injection Inject 10 Units into the skin at bedtime.     . insulin lispro (HUMALOG) 100 UNIT/ML injection Inject 2-10 Units into the skin every 6 (six) hours. 0-150=0 units 151-200=2 units 201-250=4 units 251-300=6 units 301-350=8 units 351-400=10 unts >400=call MD     . insulin lispro (HUMALOG) 100 UNIT/ML injection Inject 10 Units into the skin daily.     Marland Kitchen. ipratropium-albuterol (DUONEB) 0.5-2.5 (3) MG/3ML SOLN Take 3 mLs by nebulization every 6 (six) hours.     Marland Kitchen. loperamide (IMODIUM A-D) 2 MG tablet Place 2 mg into feeding tube 4 (four) times daily as needed for diarrhea or loose stools.     Marland Kitchen. LORazepam (ATIVAN) 0.5 MG tablet Place 0.5 mg into feeding tube every 8 (eight) hours.     . mirtazapine (REMERON) 15 MG tablet Place 15 mg into feeding tube at bedtime.     . Nutritional Supplements (FEEDING SUPPLEMENT, GLUCERNA 1.2 CAL,) LIQD Place 1,000 mLs into feeding tube continuous.     Marland Kitchen. oxyCODONE-acetaminophen (PERCOCET/ROXICET) 5-325 MG tablet Place 1 tablet into feeding tube every 6 (six) hours as needed  for severe pain.     Marland Kitchen. terbinafine (LAMISIL) 1 % cream Apply 1 application topically 2 (two) times daily.     . traMADol (ULTRAM) 50 MG tablet Place 25 mg into feeding tube 2 (two) times daily.     Marland Kitchen. VANCOMYCIN HCL PO Place 500 mg into feeding tube every 6 (six) hours.       Assessment: Patient transferred from [facility].  We have attempted to confirm ongoing medications.  We were unable to determine last doses and planned length of therapy for antibiotics.  Plan:  I have marked his record as complete.  Should new information be determined, we will be happy to review again.  Please resume those medications you feel most appropriate as he transitions through care.  Nadara MustardNita Cassidey Barrales, PharmD., MS Clinical Pharmacist Pager:  [phone] Thank you for allowing pharmacy to be part of this patients care team. 04/03/2016,8:41 AM

## 2016-04-03 NOTE — Progress Notes (Signed)
Medicine attending: I examined this patient this morning together with resident physician Dr. Nyra MarketGorica Svalina and I concur with her evaluation and management plan which we discussed together.  We are finally starting to piece together her prehospital course: Initial admission to [facility] in [place] approximately June 2017 with acute respiratory failure requiring intubation. Catheterization done during that admission revealed Takotsubo cardiomyopathy. Prolonged course on the ventilator. Tracheostomy placed. Treated for ventilator associated pneumonia. C. difficile colitis.  Transferred to [facility] August 22 for wound care and ventilator support. Treated again for ventilator associated pneumonia with cefapime and Vanco. On 8/28 developed recurrent C. difficile colitis. PEG tube was placed. She developed a abscess around the PEG tube insertion. Required incision and drainage by surgery. [facility]. Still on cefapime and Vanco and for unclear reasons, Decadron 4 mg daily. [facility]. Gastrojejunostomy tube placed Compcated by presence of a large ventral hernia. Required exploratory laparotomy. Ventral hernia repair. Removal of the initial PEG tube. Partial gastrectomy. New G-tube placed. Wound VAC placed over area of initial PEG tube wound. Sent back to [facility] on October 22. Completed an eight-day course of antibiotics. Readmitted to [facility] October 31 with necrotizing fasciitis. [facility] November 18 for further evaluation of hyperkalemia and newly diagnosed Klebsiella urinary tract infection.  Condition has improved since admission here.  Persistent diarrhea now with a rectal tube. C. difficile antigen positive, toxin negative, PCR positive. We will continue vancomycin per G-tube. No baseline chest x-ray. Not clear that she actually has a recurrent hospital-acquired pneumonia. She has been treated for  this for about 3 times already. I believe changes on current x-ray are chronic. With respect to her hyperkalemia, hyponatremia, in the face of normal renal function and no potassium supplements, ACTH stimulation test is normal. No current evidence for adrenal insufficiency. We will taper her dexamethasone. With simple fluid replacement, sodium now low normal and potassium high normal. With respect to her chronic respiratory failure, ventilator dependence, we have asked our pulmonary critical care team to evaluate the patient and make recommendations. With respect to her Klebsiella UTI, it appears that the bug is pansensitive. We can simplify her antibiotic regimen. With respect to her abdominal wound, healing by second intention, dressing removed for inspection, healthy granulation tissue. Continue local wound care.  We will await pulmonary medical care medicine recommendations prior to any consideration of transfer back to the LTAC.

## 2016-04-04 ENCOUNTER — Inpatient Hospital Stay (HOSPITAL_COMMUNITY): Payer: Medicare Other

## 2016-04-04 DIAGNOSIS — L03311 Cellulitis of abdominal wall: Secondary | ICD-10-CM

## 2016-04-04 DIAGNOSIS — Z221 Carrier of other intestinal infectious diseases: Secondary | ICD-10-CM

## 2016-04-04 LAB — BASIC METABOLIC PANEL
ANION GAP: 5 (ref 5–15)
BUN: 28 mg/dL — ABNORMAL HIGH (ref 6–20)
CALCIUM: 8 mg/dL — AB (ref 8.9–10.3)
CO2: 24 mmol/L (ref 22–32)
Chloride: 105 mmol/L (ref 101–111)
Creatinine, Ser: 0.46 mg/dL (ref 0.44–1.00)
Glucose, Bld: 136 mg/dL — ABNORMAL HIGH (ref 65–99)
Potassium: 4.7 mmol/L (ref 3.5–5.1)
SODIUM: 134 mmol/L — AB (ref 135–145)

## 2016-04-04 LAB — TYPE AND SCREEN
ABO/RH(D): B NEG
ANTIBODY SCREEN: NEGATIVE
UNIT DIVISION: 0
Unit division: 0

## 2016-04-04 LAB — CBC
HEMATOCRIT: 28.5 % — AB (ref 36.0–46.0)
HEMOGLOBIN: 9 g/dL — AB (ref 12.0–15.0)
MCH: 29.1 pg (ref 26.0–34.0)
MCHC: 31.6 g/dL (ref 30.0–36.0)
MCV: 92.2 fL (ref 78.0–100.0)
Platelets: 141 10*3/uL — ABNORMAL LOW (ref 150–400)
RBC: 3.09 MIL/uL — AB (ref 3.87–5.11)
RDW: 16.7 % — ABNORMAL HIGH (ref 11.5–15.5)
WBC: 9.9 10*3/uL (ref 4.0–10.5)

## 2016-04-04 LAB — GLUCOSE, CAPILLARY
GLUCOSE-CAPILLARY: 125 mg/dL — AB (ref 65–99)
GLUCOSE-CAPILLARY: 162 mg/dL — AB (ref 65–99)
Glucose-Capillary: 73 mg/dL (ref 65–99)
Glucose-Capillary: 143 mg/dL — ABNORMAL HIGH (ref 65–99)

## 2016-04-04 LAB — URINE CULTURE

## 2016-04-04 LAB — LACTIC ACID, PLASMA: LACTIC ACID, VENOUS: 1 mmol/L (ref 0.5–1.9)

## 2016-04-04 LAB — HEMOGLOBIN A1C
Hgb A1c MFr Bld: 9.3 % — ABNORMAL HIGH (ref 4.8–5.6)
MEAN PLASMA GLUCOSE: 220 mg/dL

## 2016-04-04 LAB — CK: Total CK: 14 U/L — ABNORMAL LOW (ref 38–234)

## 2016-04-04 MED ORDER — ENOXAPARIN SODIUM 40 MG/0.4ML ~~LOC~~ SOLN
40.0000 mg | SUBCUTANEOUS | Status: DC
  Administered 2016-04-05 – 2016-04-11 (×7): 40 mg via SUBCUTANEOUS
  Filled 2016-04-04 (×7): qty 0.4

## 2016-04-04 MED ORDER — DEXTROSE 50 % IV SOLN
INTRAVENOUS | Status: AC
  Administered 2016-04-04: 21:00:00
  Filled 2016-04-04: qty 50

## 2016-04-04 MED ORDER — CEFAZOLIN IN D5W 1 GM/50ML IV SOLN
1.0000 g | Freq: Three times a day (TID) | INTRAVENOUS | Status: DC
  Administered 2016-04-04: 1 g via INTRAVENOUS
  Filled 2016-04-04 (×3): qty 50

## 2016-04-04 MED ORDER — DEXTROSE 50 % IV SOLN: 50.0000 mL | Freq: Once | INTRAVENOUS | Status: AC

## 2016-04-04 MED ORDER — IOPAMIDOL (ISOVUE-300) INJECTION 61%
INTRAVENOUS | Status: AC
  Administered 2016-04-04: 100 mL
  Filled 2016-04-04: qty 100

## 2016-04-04 MED ORDER — DEXAMETHASONE 2 MG PO TABS
2.0000 mg | ORAL_TABLET | Freq: Every day | ORAL | Status: DC
  Administered 2016-04-04 – 2016-04-10 (×7): 2 mg
  Filled 2016-04-04 (×9): qty 1

## 2016-04-04 MED ORDER — PIPERACILLIN-TAZOBACTAM 3.375 G IVPB
3.3750 g | Freq: Three times a day (TID) | INTRAVENOUS | Status: DC
  Administered 2016-04-05 – 2016-04-06 (×4): 3.375 g via INTRAVENOUS
  Filled 2016-04-04 (×6): qty 50

## 2016-04-04 MED ORDER — INSULIN ASPART 100 UNIT/ML ~~LOC~~ SOLN
0.0000 [IU] | Freq: Three times a day (TID) | SUBCUTANEOUS | Status: DC
  Administered 2016-04-05 (×2): 2 [IU] via SUBCUTANEOUS
  Administered 2016-04-06: 1 [IU] via SUBCUTANEOUS
  Administered 2016-04-06: 5 [IU] via SUBCUTANEOUS
  Administered 2016-04-07: 2 [IU] via SUBCUTANEOUS
  Administered 2016-04-07: 1 [IU] via SUBCUTANEOUS
  Administered 2016-04-07: 3 [IU] via SUBCUTANEOUS

## 2016-04-04 MED ORDER — DEXTROSE 5 % IV SOLN: 1.0000 g | INTRAVENOUS | Status: DC

## 2016-04-04 MED ORDER — VANCOMYCIN HCL IN DEXTROSE 750-5 MG/150ML-% IV SOLN
750.0000 mg | INTRAVENOUS | Status: DC
  Administered 2016-04-04: 750 mg via INTRAVENOUS
  Filled 2016-04-04 (×2): qty 150

## 2016-04-04 MED ORDER — PIPERACILLIN-TAZOBACTAM 3.375 G IVPB 30 MIN
3.3750 g | Freq: Once | INTRAVENOUS | Status: AC
  Administered 2016-04-04: 3.375 g via INTRAVENOUS
  Filled 2016-04-04: qty 50

## 2016-04-04 MED ORDER — CLINDAMYCIN PHOSPHATE 600 MG/50ML IV SOLN
600.0000 mg | Freq: Three times a day (TID) | INTRAVENOUS | Status: DC
  Filled 2016-04-04 (×2): qty 50

## 2016-04-04 NOTE — Progress Notes (Signed)
CT results called into Resident Janice Watson ; showed necrotizing fas/PNA . Peg tube not dislodged, . Discussed hypoglycemia in setting of Tube feeds on hold. Orders for D10 at 75 and consult to surgery per Resident . Continue to hold all PO per tube administration

## 2016-04-04 NOTE — Consult Note (Signed)
Reason for Consult:G-tube leaking Referring Physician: Murriel Hopper  Janice Watson is an 82 y.o. female.  HPI: Janice Watson was admitted from [facility] on 11/18 with a dx of PNA. For the last 3 days she's been having pain associated with her PEG tube site though it's a little better today. She also notes the PEG has been intermittently clogged during the same time frame. She is non-verbal with a trach and on vent but very alert and can answer yes/no questions and mouth some things. Last night she started having some brownish drainage from around the PEG. She denied N/V.  Past Medical History:  Diagnosis Date  . Anxiety   . C. difficile colitis   . Chronic anemia   . COPD (chronic obstructive pulmonary disease) ([place])   . GERD (gastroesophageal reflux disease)   . Healthcare-associated pneumonia   . Hypertension   . Neuropathic bladder   . Pneumonia   . Protein calorie malnutrition ([place])   . Takotsubo cardiomyopathy   . Ventilator dependent ([place])     No past surgical history on file.  No family history on file.  Social History:  has no tobacco, alcohol, and drug history on file.  Allergies:  Allergies  Allergen Reactions  . Tape Itching    Medications: I have reviewed the patient's current medications.  Results for orders placed or performed during the hospital encounter of 03/31/16 (from the past 48 hour(s))  Glucose, capillary     Status: Abnormal   Collection Time: 04/02/16  4:32 PM  Result Value Ref Range   Glucose-Capillary 259 (H) 65 - 99 mg/dL   Comment 1 Capillary Specimen    Comment 2 Notify RN   Culture, Urine     Status: Abnormal   Collection Time: 04/02/16  5:17 PM  Result Value Ref Range   Specimen Description URINE, CATHETERIZED    Special Requests Cefepime    Culture MULTIPLE SPECIES PRESENT, SUGGEST RECOLLECTION (A)    Report Status 04/04/2016 FINAL   ACTH stimulation, 3 time points     Status: None   Collection Time: 04/02/16  6:46 PM  Result  Value Ref Range   Cortisol, Base 9.6 ug/dL    Comment: NO NORMAL RANGE ESTABLISHED FOR THIS TEST   Cortisol, 30 Min 24.2 ug/dL   Cortisol, 60 Min 25.4 ug/dL  Glucose, capillary     Status: Abnormal   Collection Time: 04/02/16  8:45 PM  Result Value Ref Range   Glucose-Capillary 220 (H) 65 - 99 mg/dL   Comment 1 Capillary Specimen    Comment 2 Notify RN    Comment 3 Document in Chart   Glucose, capillary     Status: Abnormal   Collection Time: 04/03/16 12:26 AM  Result Value Ref Range   Glucose-Capillary 218 (H) 65 - 99 mg/dL   Comment 1 Capillary Specimen    Comment 2 Notify RN    Comment 3 Document in Chart   Procalcitonin     Status: None   Collection Time: 04/03/16  4:20 AM  Result Value Ref Range   Procalcitonin 0.33 ng/mL    Comment:        Interpretation: PCT (Procalcitonin) <= 0.5 ng/mL: Systemic infection (sepsis) is not likely. Local bacterial infection is possible. (NOTE)         ICU PCT Algorithm               Non ICU PCT Algorithm    ----------------------------     ------------------------------  PCT < 0.25 ng/mL                 PCT < 0.1 ng/mL     Stopping of antibiotics            Stopping of antibiotics       strongly encouraged.               strongly encouraged.    ----------------------------     ------------------------------       PCT level decrease by               PCT < 0.25 ng/mL       >= 80% from peak PCT       OR PCT 0.25 - 0.5 ng/mL          Stopping of antibiotics                                             encouraged.     Stopping of antibiotics           encouraged.    ----------------------------     ------------------------------       PCT level decrease by              PCT >= 0.25 ng/mL       < 80% from peak PCT        AND PCT >= 0.5 ng/mL            Continuin g antibiotics                                              encouraged.       Continuing antibiotics            encouraged.    ----------------------------      ------------------------------     PCT level increase compared          PCT > 0.5 ng/mL         with peak PCT AND          PCT >= 0.5 ng/mL             Escalation of antibiotics                                          strongly encouraged.      Escalation of antibiotics        strongly encouraged.   Hemoglobin A1c     Status: Abnormal   Collection Time: 04/03/16  4:20 AM  Result Value Ref Range   Hgb A1c MFr Bld 9.3 (H) 4.8 - 5.6 %    Comment: (NOTE)         Pre-diabetes: 5.7 - 6.4         Diabetes: >6.4         Glycemic control for adults with diabetes: <7.0    Mean Plasma Glucose 220 mg/dL    Comment: (NOTE) Performed At: [facility] [place], [place] [id number] Lindon Romp MD [id number]   CBC     Status: Abnormal   Collection Time: 04/03/16  4:20 AM  Result Value  Ref Range   WBC 8.9 4.0 - 10.5 K/uL   RBC 3.33 (L) 3.87 - 5.11 MIL/uL   Hemoglobin 9.6 (L) 12.0 - 15.0 g/dL   HCT 30.5 (L) 36.0 - 46.0 %   MCV 91.6 78.0 - 100.0 fL   MCH 28.8 26.0 - 34.0 pg   MCHC 31.5 30.0 - 36.0 g/dL   RDW 16.4 (H) 11.5 - 15.5 %   Platelets 167 150 - 400 K/uL  Basic metabolic panel     Status: Abnormal   Collection Time: 04/03/16  4:20 AM  Result Value Ref Range   Sodium 135 135 - 145 mmol/L   Potassium 4.8 3.5 - 5.1 mmol/L   Chloride 106 101 - 111 mmol/L   CO2 24 22 - 32 mmol/L   Glucose, Bld 130 (H) 65 - 99 mg/dL   BUN 26 (H) 6 - 20 mg/dL   Creatinine, Ser 0.54 0.44 - 1.00 mg/dL   Calcium 8.4 (L) 8.9 - 10.3 mg/dL   GFR calc non Af Amer >60 >60 mL/min   GFR calc Af Amer >60 >60 mL/min    Comment: (NOTE) The eGFR has been calculated using the CKD EPI equation. This calculation has not been validated in all clinical situations. eGFR's persistently <60 mL/min signify possible Chronic Kidney Disease.    Anion gap 5 5 - 15  Glucose, capillary     Status: Abnormal   Collection Time: 04/03/16  4:23 AM  Result Value Ref Range   Glucose-Capillary 128 (H)  65 - 99 mg/dL  Glucose, capillary     Status: None   Collection Time: 04/03/16  8:00 AM  Result Value Ref Range   Glucose-Capillary 93 65 - 99 mg/dL   Comment 1 Notify RN   Glucose, capillary     Status: Abnormal   Collection Time: 04/03/16 11:54 AM  Result Value Ref Range   Glucose-Capillary 142 (H) 65 - 99 mg/dL   Comment 1 Notify RN   Glucose, capillary     Status: Abnormal   Collection Time: 04/03/16  3:26 PM  Result Value Ref Range   Glucose-Capillary 152 (H) 65 - 99 mg/dL   Comment 1 Notify RN   Glucose, capillary     Status: Abnormal   Collection Time: 04/03/16  8:30 PM  Result Value Ref Range   Glucose-Capillary 156 (H) 65 - 99 mg/dL  Glucose, capillary     Status: Abnormal   Collection Time: 04/03/16 11:21 PM  Result Value Ref Range   Glucose-Capillary 224 (H) 65 - 99 mg/dL  Glucose, capillary     Status: Abnormal   Collection Time: 04/04/16  4:45 AM  Result Value Ref Range   Glucose-Capillary 125 (H) 65 - 99 mg/dL   Comment 1 Capillary Specimen   CBC     Status: Abnormal   Collection Time: 04/04/16  5:03 AM  Result Value Ref Range   WBC 9.9 4.0 - 10.5 K/uL   RBC 3.09 (L) 3.87 - 5.11 MIL/uL   Hemoglobin 9.0 (L) 12.0 - 15.0 g/dL   HCT 28.5 (L) 36.0 - 46.0 %   MCV 92.2 78.0 - 100.0 fL   MCH 29.1 26.0 - 34.0 pg   MCHC 31.6 30.0 - 36.0 g/dL   RDW 16.7 (H) 11.5 - 15.5 %   Platelets 141 (L) 150 - 400 K/uL  Basic metabolic panel     Status: Abnormal   Collection Time: 04/04/16  5:03 AM  Result Value Ref Range   Sodium 134 (  L) 135 - 145 mmol/L   Potassium 4.7 3.5 - 5.1 mmol/L   Chloride 105 101 - 111 mmol/L   CO2 24 22 - 32 mmol/L   Glucose, Bld 136 (H) 65 - 99 mg/dL   BUN 28 (H) 6 - 20 mg/dL   Creatinine, Ser 0.46 0.44 - 1.00 mg/dL   Calcium 8.0 (L) 8.9 - 10.3 mg/dL   GFR calc non Af Amer >60 >60 mL/min   GFR calc Af Amer >60 >60 mL/min    Comment: (NOTE) The eGFR has been calculated using the CKD EPI equation. This calculation has not been validated in all  clinical situations. eGFR's persistently <60 mL/min signify possible Chronic Kidney Disease.    Anion gap 5 5 - 15  Glucose, capillary     Status: None   Collection Time: 04/04/16  8:15 AM  Result Value Ref Range   Glucose-Capillary 73 65 - 99 mg/dL   Comment 1 Notify RN   Glucose, capillary     Status: Abnormal   Collection Time: 04/04/16 12:07 PM  Result Value Ref Range   Glucose-Capillary 162 (H) 65 - 99 mg/dL   Comment 1 Notify RN     Dg Chest Port 1 View  Result Date: 04/03/2016 CLINICAL DATA:  Increased sputum from tracheostomy EXAM: PORTABLE CHEST 1 VIEW COMPARISON:  03/31/2016 FINDINGS: Tracheostomy tube is again noted in satisfactory position. Right-sided PICC line is again noted in the superior vena cava. The cardiac shadow is stable. Aortic calcifications are again seen and stable. Mild bilateral increased opacities are again seen slightly greater on the left than the right although stable from the prior exam. No bony abnormality is noted. IMPRESSION: Stable bilateral opacities without acute abnormality. Electronically Signed   By: Inez Catalina M.D.   On: 04/03/2016 13:22    Review of Systems  Unable to perform ROS: Patient nonverbal  Gastrointestinal: Positive for abdominal pain. Negative for nausea and vomiting.   Blood pressure (!) 106/54, pulse 90, temperature 97 F (36.1 C), temperature source Oral, resp. rate 17, height _0  (1.549 m), weight 53.5 kg (117 lb 15.1 oz), SpO2 100 %. Physical Exam  GI: Soft. Bowel sounds are normal. She exhibits no distension. There is tenderness in the left upper quadrant. There is no rebound and no guarding.      Assessment/Plan: Abd wall cellulitis -- Will get CT to r/o abscess and to ascertain PEG position. I worry, given its propensity to clog lately, that it has become dislodged. Recommend broad spectrum abx for now, cultures are to be obtained. The drainage does not appear feculent; I would favor gastric contents or possibly  some combination of TF and purulence. Hold TF and meds for now.    Lisette Abu, PA-C Pager: [phone] General Trauma PA Pager: [phone] 04/04/2016, 12:22 PM

## 2016-04-04 NOTE — Evaluation (Signed)
Physical Therapy Evaluation Patient Details Name: Janice BlackwaterCatherine Hedberg MRN: [id number] DOB: [birth year] Today's Date: 04/04/2016   History of Present Illness  82 year old woman [facility] for [facility] and hyperkalemia. The [facility] has [facility] at [facility] after [place] which has left her with a #8 tracheostomy and dependent on full ventilator support. Two days ago the physicians at the facility noted that her functional status and interaction was reduced, she was more somnolence, not responding to questions, not interactive.  Clinical Impression  Pt admitted with above. Pt not oriented to place, reported [place], [place] and [place] agreed. [facility] Pt very deconditioned but tolerated EOB sitting x 7 min with minA, participated in LE there ex x 10 reps, LAQ, ankle pumps and marching. Pt requires maxAx2 to achieve standing and std pvt. Acute PT to con't to follow.    Follow Up Recommendations LTACH    Equipment Recommendations  None recommended by PT    Recommendations for Other Services       Precautions / Restrictions Precautions Precautions: Fall Precaution Comments: trach/vent, PEG tube Restrictions Weight Bearing Restrictions: No      Mobility  Bed Mobility Overal bed mobility: Needs Assistance;+2 for physical assistance Bed Mobility: Supine to Sit     Supine to sit: +2 for physical assistance;Max assist     General bed mobility comments: pt initiated with LE mvmt but then required maxA, maxA for trunk elevation and to bring hips to EOB  Transfers Overall transfer level: Needs assistance   Transfers: Sit to/from Stand;Stand Pivot Transfers Sit to Stand: Max assist;+2 physical assistance Stand pivot transfers: Max assist;+2 physical assistance       General transfer comment: pt unable to achieve full  upright posture, bilat blocking of knees required, maxA to advance LEs to step to chair  Ambulation/Gait                Stairs            Wheelchair Mobility    Modified Rankin (Stroke Patients Only)       Balance Overall balance assessment: Needs assistance Sitting-balance support: Feet supported;Bilateral upper extremity supported Sitting balance-Leahy Scale: Poor Sitting balance - Comments: minA to maintain midline, pt with R Lateral lean Postural control: Right lateral lean                                   Pertinent Vitals/Pain Pain Assessment: No/denies pain    Home Living Family/patient expects to be discharged to::  ([facility])                 Additional Comments: [facility]    Prior Function Level of Independence: Needs assistance   Gait / Transfers Assistance Needed: pt non-ambulatory  ADL's / Homemaking Assistance Needed: pt dependent for bathing        Hand Dominance   Dominant Hand: Right    Extremity/Trunk Assessment   Upper Extremity Assessment: Generalized weakness (bilat frozen shld, limited to 50 deg shld flex AA)           Lower Extremity Assessment: Generalized weakness (grossly 3-/5)      Cervical / Trunk Assessment: Kyphotic  Communication   Communication: Tracheostomy (shakes head yes/no and mouths words)  Cognition Arousal/Alertness: Awake/alert Behavior [facility]  for tasks assessed/performed Overall Cognitive Status: Within Functional Limits for tasks assessed                      General Comments      Exercises     Assessment/Plan    PT Assessment Patient needs continued PT services  PT Problem List Decreased strength;Decreased range of motion;Decreased activity tolerance;Decreased balance;Decreased mobility          PT Treatment Interventions DME instruction;Gait training;Functional mobility training;Balance training;Therapeutic exercise;Therapeutic  activities    PT Goals (Current goals can be found in the Care Plan section)  Acute Rehab PT Goals Patient Stated Goal: didn't state PT Goal Formulation: With patient Time For Goal Achievement: 04/11/16 Potential to Achieve Goals: Good    Frequency Min 2X/week   Barriers to discharge        Co-evaluation               End of Session Equipment Utilized During Treatment: Gait belt Activity Tolerance: Patient tolerated treatment well Patient left: in chair;with call bell/phone within reach;with nursing/sitter in room Nurse Communication: Mobility status         Time: [id number] PT Time Calculation (min) (ACUTE ONLY): 32 min   Charges:   PT Evaluation $PT Eval High Complexity: 1 Procedure PT Treatments $Therapeutic Activity: 8-22 mins   PT G CodesRozell Searing:        Nayda Riesen M Jurni Cesaro 04/04/2016, 9:06 AM  Lewis ShockAshly Yailyn Strack, PT, DPT Pager #: [phone] Office #: [phone]

## 2016-04-04 NOTE — Progress Notes (Signed)
Hypoglycemic Event  CBG: 49  Treatment: D50 IV 50 mL  Symptoms: None  Follow-up CBG: [id number] CBG Result:229  Possible Reasons for Event: Inadequate meal intake  Comments/MD notified:Resident Notified     Hyman Hopesyako, Aurthur Wingerter O

## 2016-04-04 NOTE — Progress Notes (Addendum)
Janice BlackwaterCatherine Watson was seen and evaluated by myself and Dr. Earlene PlaterWallace at approximately 10pm after the results of her CT abdomen/pelvis returned. CT showed diffuse soft tissue air tracking along the anterior abdominal wall, extending from the LUQ -> LLQ. This is concerning for necrotizing fasciitis. G-tube was noted to be in correct position. CT also demonstrated pneumonia in left lower lobe in addition to a rounded right-middle lobe opacity which may represent a small infectious cavitation. Dr. Lindie SpruceWyatt, on call for surgery, was contacted shortly after receiving these results. Reports gas is likely secondary to air leaking around pts PEG tube however did rec broadening abx to include more gram negative and anaerobic coverage. Nursing also reports that the patient was hypoglycemic to 49 and responded appropriately to am D50.  The patient was resting comfortably in bed and was in no acute distress. She was non-verbal however responded to yes or no questions without issue. She reports her abdominal pain has worsened significantly throughout the day. On examination, the abdomen is diffusely tender however most pronounced in the LU and LL quadrants. Erythema and induration over and surround area of G-tube.   CBC    Component Value Date/Time   WBC 9.9 04/04/2016 0503   RBC 3.09 (L) 04/04/2016 0503   HGB 9.0 (L) 04/04/2016 0503   HCT 28.5 (L) 04/04/2016 0503   PLT 141 (L) 04/04/2016 0503   MCV 92.2 04/04/2016 0503   MCH 29.1 04/04/2016 0503   MCHC 31.6 04/04/2016 0503   RDW 16.7 (H) 04/04/2016 0503   LYMPHSABS 0.4 (L) 03/31/2016 2039   MONOABS 0.2 03/31/2016 2039   EOSABS 0.0 03/31/2016 2039   BASOSABS 0.0 03/31/2016 2039   BMET    Component Value Date/Time   NA 134 (L) 04/04/2016 0503   K 4.7 04/04/2016 0503   CL 105 04/04/2016 0503   CO2 24 04/04/2016 0503   GLUCOSE 136 (H) 04/04/2016 0503   BUN 28 (H) 04/04/2016 0503   CREATININE 0.46 04/04/2016 0503   CALCIUM 8.0 (L) 04/04/2016 0503   GFRNONAA >60 04/04/2016 0503   GFRAA >60 04/04/2016 0503   Plan: Case discussed with on call surgeon who suspects air is secondary to air-leak from stomach around G tube. She has been placed on antibiotics appropriate for necrotizing infections: d/c Ancef, begin IV Zosyn, continue IV Vancomycin. Surg will re-evaluate in morning. Has remained afebrile since admission and is without leukocytosis. Remaining vitals appear stable. Hypoglycemic episode likely secondary to tube-feed holding. Responded appropriately to amp D50. Have adjusted insulin sliding scale to accomodate NPO.

## 2016-04-04 NOTE — Progress Notes (Addendum)
Pharmacy Antibiotic Note  Janice BlackwaterCatherine Watson is a 82 y.o. female admitted on 03/31/2016 with pneumonia.  Patient on cefepime for PNA and Pharmacy has been consulted to change to zosyn for concern of abdominal infection (abdominal fluid w/ GNR and GPR -CrCl ~ 40   Plan: -zosyn 3.375gm IV q8h -Will follow renal function, cultures and clinical progress   Height: 5\' 1"  (154.9 cm) (per patient) Weight: 117 lb 15.1 oz (53.5 kg) IBW/kg (Calculated) : 47.8  Temp (24hrs), Avg:98.2 F (36.8 C), Min:97.9 F (36.6 C), Max:98.5 F (36.9 C)   Recent Labs Lab 03/31/16 2039 03/31/16 2052  04/01/16 0044 04/01/16 0350 04/01/16 1053 04/01/16 2200 04/02/16 0415 04/03/16 0420 04/04/16 0503  WBC 18.6*  --   --   --  10.2  --   --  8.8 8.9 9.9  CREATININE 0.70 0.60  < >  --  0.79 0.93 0.75 0.65 0.54 0.46  LATICACIDVEN  --  1.70  --  2.44*  --   --   --   --   --   --   < > = values in this interval not displayed.  Estimated Creatinine Clearance: 40.9 mL/min (by C-G formula based on SCr of 0.46 mg/dL).    Allergies  Allergen Reactions  . Tape Itching     Antimicrobials this admission:  Cefepime 11/19 >> Fluc 11/18 >> PO Vanc 11/18 >>   Dose adjustments this admission:  N/A  Microbiology results:  11/19 Sputum:  mult organisms 11/19 MRSA PCR: negative 11/19, 11/20 UCx - suggest recollection 11/19 BCx x2 - NGTD 11/20 C.diff - positive 11/22 abdominal fluid: GNR, GPR  Harland GermanAndrew Anysa Tacey, Pharm D 04/04/2016 10:04 PM

## 2016-04-04 NOTE — Progress Notes (Signed)
Medicine attending:  I personally examined this patient today together with resident physician Dr. Nyra MarketGorica Svalina and I concur with her evaluation and management plan which we reviewed together. #1. "Hospital-acquired pneumonia" I think this diagnosis is tenuous. She has had multiple treatments for hospital-acquired pneumonia over the last 6 months. We have no baseline x-ray data for comparison. Chest x-ray and admission here November 18 with changes that are likely chronic. We have tailored her antibiotics to reflect this. #2. Klebsiella urinary tract infection Currently on Cefipime. We will likely change this to cefazolin today. #3. Ventilator dependent respiratory failure We greatly appreciate pulmonary critical care assistance. They feel that she can be potentially weaned off the ventilator. #4. Hyperkalemia/hyponatremia Evaluation negative for adrenal insufficiency. Electrolytes have stabilized. We are tapering her Decadron. Reason she was on this in the first place unclear. #5. Large abdominal wound left lower quadrant healing by second intention with healthy granulation tissue at the base. #6. Increasing pain and area of cellulitis with fluctuance at the site of G-tube in the left upper quadrant. There appears to be stool exiting from the entry site of the G-tube. We are going to get a ultrasound to rule out abscess. Surgical consultation based on these results. Culture of the exudate from the G-tube entry. This evaluation needs to be completed before she is stable enough to return to the long-term care facility.

## 2016-04-04 NOTE — Progress Notes (Signed)
PULMONARY / CRITICAL CARE MEDICINE   Name: Janice BlackwaterCatherine Kabat MRN: [id number] DOB: 03/16/1932    ADMISSION DATE:  03/31/2016 CONSULTATION DATE:  11/21  REFERRING MD:  TS  CHIEF COMPLAINT:  ?pneumonia  HISTORY OF PRESENT ILLNESS:   82 yo [facility] ) resident who is trach/vent dependent, DNR, and was [facility] with CC of pneumonia and hyperkalemia with preserved renal function. She has a PICC and G tube in place. Currently stable, VSS, and is awake and alert on full vent support. Being treated for pneumonia with Vanc/maxipime. She has recent hx of being trach dependent prior to transfer to [facility] from [facility] in [place]. PCCM asked to help with vent.  PAST MEDICAL HISTORY :  She  has a past medical history of Anxiety; C. difficile colitis; Chronic anemia; COPD (chronic obstructive pulmonary disease) (HCC); GERD (gastroesophageal reflux disease); Healthcare-associated pneumonia; Hypertension; Neuropathic bladder; Pneumonia; Protein calorie malnutrition (HCC); Takotsubo cardiomyopathy; and Ventilator dependent (HCC).  PAST SURGICAL HISTORY: She  has no past surgical history on file.  Allergies  Allergen Reactions  . Tape Itching    No current facility-administered medications on file prior to encounter.    No current outpatient prescriptions on file prior to encounter.    FAMILY HISTORY:  Her has no family status information on file.    SOCIAL HISTORY:  REVIEW OF SYSTEMS:   NA  SUBJECTIVE:  NAD  VITAL SIGNS: BP 138/70   Pulse 80   Temp 97 F (36.1 C) (Oral)   Resp (!) 30   Ht 5\' 1"  (1.549 m) Comment: per patient  Wt 117 lb 15.1 oz (53.5 kg)   SpO2 100%   BMI 22.29 kg/m   HEMODYNAMICS:    VENTILATOR SETTINGS: Vent Mode: PSV;CPAP FiO2 (%):  [40 %] 40 % Set Rate:  [16 bmp] 16 bmp Vt Set:  [400 mL] 400 mL PEEP:  [5 cmH20] 5 cmH20 Pressure Support:  [10 cmH20-15 cmH20] 10 cmH20 Plateau Pressure:  [17 cmH20-18 cmH20] 18 cmH20  INTAKE /  OUTPUT: I/O last 3 completed shifts: In: 5292.6 [I.V.:3168.3; Other:295; NG/GT:1679.3; IV Piggyback:150] Out: 4125 [Urine:3925; Stool:200]  PHYSICAL EXAMINATION: General:  Frail female, no distress Neuro:  Communicates non verbal HEENT: Trach in place Cardiovascular:  RRR, No MRG Lungs: Clear, no wheeze or crackles Abdomen:  Soft + bs, healing wound Musculoskeletal:  intact Skin: warm and dry  LABS:  BMET  Recent Labs Lab 04/02/16 0415 04/03/16 0420 04/04/16 0503  NA 131* 135 134*  K 5.5* 4.8 4.7  CL 103 106 105  CO2 22 24 24   BUN 39* 26* 28*  CREATININE 0.65 0.54 0.46  GLUCOSE 236* 130* 136*    Electrolytes  Recent Labs Lab 04/01/16 1053  04/02/16 0415 04/03/16 0420 04/04/16 0503  CALCIUM 8.6*  < > 8.2* 8.4* 8.0*  MG 1.9  --   --   --   --   < > = values in this interval not displayed.  CBC  Recent Labs Lab 04/02/16 0415 04/03/16 0420 04/04/16 0503  WBC 8.8 8.9 9.9  HGB 9.8* 9.6* 9.0*  HCT 30.5* 30.5* 28.5*  PLT 143* 167 141*    Coag's No results for input(s): APTT, INR in the last 168 hours.  Sepsis Markers  Recent Labs Lab 03/31/16 2052 04/01/16 0044 04/01/16 1053 04/03/16 0420  LATICACIDVEN 1.70 2.44*  --   --   PROCALCITON  --   --  0.62 0.33    ABG  Recent Labs Lab 03/31/16 2111  PHART 7.382  PCO2ART 38.1  PO2ART 88.0    Liver Enzymes  Recent Labs Lab 03/31/16 2039 04/01/16 1053  AST 22 21  ALT 26 24  ALKPHOS 100 98  BILITOT 0.3 0.4  ALBUMIN 1.9* 1.8*    Cardiac Enzymes  Recent Labs Lab 04/01/16 0350 04/01/16 1054  TROPONINI 0.05* 0.04*    Glucose  Recent Labs Lab 04/03/16 1526 04/03/16 2030 04/03/16 2321 04/04/16 0445 04/04/16 0815 04/04/16 1207  GLUCAP 152* 156* 224* 125* 73 162*    Imaging No results found.   STUDIES:    CULTURES: 11/20 uc>> 11/20 c diff>>++ 11/19 bc>> 11/19 sputum > mul sp  ANTIBIOTICS: 11/19 vanc>> 11/19 cefepime>>  SIGNIFICANT EVENTS: 11/18 tx to  [facility]  LINES/TUBES: Rt picc>> #8 shiley XLT >> G tube>>  DISCUSSION: 82 yo [facility] ) resident who is trach/vent dependent, DNR, and was [facility] with CC of pneumonia and hyperkalemia with preserved renal function. She has a PICC and G tube in place. Currently stable, VSS, and is awake and alert on full vent support. Being treated for pneumonia with Vanc/maxipime. She has recent hx of being trach dependent prior to transfer to [facility] from [facility] in [place]. PCCM asked to help with vent.  ASSESSMENT / PLAN:  PULMONARY A: Trach and Vent dependent # 8 shiley xlt P:   Wean as able Continue PSV trials.   CARDIOVASCULAR A:  Hx of Takotsubo CM Hx of HTN P:  Follow hd profile Consider 2 d echo but she is DNR and is HD stable currently   RENAL Lab Results  Component Value Date   CREATININE 0.46 04/04/2016   CREATININE 0.54 04/03/2016   CREATININE 0.65 04/02/2016    Recent Labs Lab 04/02/16 0415 04/03/16 0420 04/04/16 0503  K 5.5* 4.8 4.7     Recent Labs Lab 04/02/16 0415 04/03/16 0420 04/04/16 0503  NA 131* 135 134*     A:   No acute issues currently P:   Follow labs  GASTROINTESTINAL A:   GI protection P:   PPI  HEMATOLOGIC A:   Anemia P:  Transfuse per protocol  INFECTIOUS A:   ? pneumonia by c x r. No leukocytosis or fever P:   Vanc and cefepime per TS  ENDOCRINE CBG (last 3)   Recent Labs  04/04/16 0445 04/04/16 0815 04/04/16 1207  GLUCAP 125* 73 162*  Chronic steroid use with decadron    A:   DM P:   SSI per TS Follow cortisol level  NEUROLOGIC A:   No acute issue, follows commands, non verbal P:   RASS goal: 0 Minimum sedation    FAMILY  - Updates: none at bedside - Inter-disciplinary family meet or Palliative Care meeting due by:  11/28  PCCM will be available over the long weekend as needed.  Chilton GreathousePraveen Handsome Anglin MD [facility] Pager [phone] If no answer or after 3pm  call: [phone] 04/04/2016, 3:21 PM

## 2016-04-04 NOTE — Progress Notes (Signed)
Patient had a CT scan of the abdomen this evening which shows a soft tissue infection of the abdominal wall near the G-tube site.  There had been leaking of the G-tube previously and this is likely associated with some of the gas seen on the CT.  Looking at the patient's vital signs and WBC if does not seem as though she is in need of urgent exploration and debridement, but will look at this again in the morning to seen if the G-tube has to be removed, replaced or adjusted somehow in association with debridement of the abdominal wall.  Marta LamasJames O. Gae BonWyatt, III, MD, FACS [phone]--pager [phone]--office [facility]

## 2016-04-04 NOTE — Progress Notes (Addendum)
Subjective:  No acute events overnight. Patient is sitting up in the chair and appears comfortable. She indicates that she is not having shortness of breath and that her abdominal pain is better today. She indicates she has no other complaints today.   Objective:  Vital signs in last 24 hours: Vitals:   04/04/16 0600 04/04/16 0700 04/04/16 0821 04/04/16 0830  BP: 121/66 (!) 114/55  122/68  Pulse: 78 67  84  Resp: 17 17  (!) 28  Temp:  97.9 F (36.6 C)    TempSrc:  Oral    SpO2: 100% 100% 98% 100%  Weight:      Height:       Constitutional: NAD, VS reviewed CV: RRR, no murmurs, rubs or gallops appreciated, no LE edema, pulses intact Resp: trach in place with sputum and secretions present; mechanical breath sounds present bilaterally, with rhonchi, no wheezing Vent settings: PRVC, FiO2 40%, RR 16, TV 400, peep 5 Abd: G tube in place with dark brown seepage - tube does not seem to be too secure, soft, +BS, tender over G tube site, erythema and induration over in surround area of G tube; wound at ventral hernia repair site with granulation tissue- dressing in place; contact dermatitis surrounding wound site and near G-tube GU: Foley cath in place Neuro: Opens eyes and appears alert, grip strength intact bilaterally, symmetrical smile, able to follow commands.  Assessment/Plan:  Principal Problem:   Hyperkalemia Active Problems:   Anemia   Pressure injury of skin   Ventilator associated pneumonia (HCC)   Hyperglycemia   Hyponatremia   Ventilator dependent (HCC)   UTI due to Klebsiella species   Recurrent colitis due to Clostridium difficile  Ventilator Dependent: Unclear as to actual presence of pneumonia -CXR suggestive of pneumonia but was not great study due to positioning; sputum gram stain showing G- rods, G+ rods, G+ cocci in pairs. Sputum culture shows multiple organisms present with none predominant. Continued on Cefepime for now to cover UTI.    --f/u blood cultures  no growth x2d --IV cefepime, day 3 --duonebs q6hrs --we have consulted pulmonary for assistance in assessing vent need and possible weaning - appreciate their help  G-tube dependent: Patient with dark drainage from her G tube site and cuff does not seem to be holding the G tube in place very well. Surgery was consulted for evaluation, possible G tube exchange and further imaging to evaluate for fistula that would be causing the feculent drainage.  --surgery consulted - appreciate their assistance  Abdominal wall cellulitis: Patient with cellulitis adjacent to her G tube; Bedside U/S did not reveal an abscess, and was consistent with inflammation. Staph and strep are most likely species for bacterial cellulitis and patient does have an extensive history of living at SNF and multiple hospitalizations which would put her at risk of MRSA infection. We will cover for MRSA for now as IV cefepime did not seem to improve the infection.  --IV vanc for now  C difficile colonization:  C dif testing showed +antigen, - toxin, and +PCR; patient likely colonized.  --d/c vanc per G tube   Complicated UTI: Patient with chronic indwelling catheter. Per chart review, patient has history of Klebsiella UTI and per nursing at [facility] had prelim urine culture at transfer showed K. Pneumonia and was about to be started on ertapenem for this infection. UA on admission showed RBC's WBC, and few bacteria which could be consistent with UTI - she was started on cefepime on  admission. Initial urine culture had too many species growing so repeat was obtained; this is still pending but may not be accurate as patient had a few days of antibiotics already.   --f/u urine culture --switch to cefazolin until culture and sensitivities come back.  Hyperkalemia:  Resolved; K this AM is 4.7. ACTH stim test was negative so will be titrating down dexamethasone from 4mg  daily to 2mg  daily x 1 week.  --f/u AM BMP  Chronic  anemia: Stable. FOBT was negative, UA was positive for RBC's likely due to trauma as foley was just changed prior to study. Will continue to monitor.  --f/u AM CBC  T2DM: SBG this AM 136. Will continue with current regimen and adjust if steroids are discontinued.  -- Lantus 15 -- SSI - R  HLD: -- Continue home Cholestyramine 4 mg QID per tube  GERD: -- Continue home pepcid 20 mg per tube daily   Anxiety: -- Continue home Buspar -- Continue home ativan 0.5 q8hrs prn per tube -- Continue home remeron 15 mg QHS  FEN: tube feeds per G tube per nutrition, free water 100ml per G tube,  IVF 0.45% NS CODE: DNR DVT PPx: lovenox  Dispo: Anticipated discharge in approximately 2-3 day(s). [facility]. We will await surgery recc's and attempt to narrow Abx for infection treatment before transfer as there were new developments today.   Nyra MarketGorica Shepard Keltz, MD 04/04/2016, 8:59 AM Pager [phone]

## 2016-04-04 NOTE — Consult Note (Signed)
Re-consulted for new questionable contact dermatitis related at her abdominal wound.  She has a chronic non healing surgical wound that my WOC partner evaluated on Sunday of this week.  At that time the staff was ordered to use tape to secure the dressing, since the patient has developed what appears to be a contact dermatitis where the tape was touching the skin, the patient has reported tape allergy.  Now the staff have started to use barrier cream on the affected areas and only use silicone based dressings to hold moist gauze in place in hopes this would not irritate her skin.  I agree with plan to minimize tape exposure.  Many patients who have tape allergies or tape sensitivity do well with silicone based products.  Discussed POC with patient and bedside nurse.  Re consult if needed, will not follow at this time. Thanks  Kaysha Parsell M.D.C. [employer] MSN, RN,CWOCN, CNS [phone])

## 2016-04-05 DIAGNOSIS — D649 Anemia, unspecified: Secondary | ICD-10-CM

## 2016-04-05 DIAGNOSIS — N390 Urinary tract infection, site not specified: Secondary | ICD-10-CM

## 2016-04-05 DIAGNOSIS — E871 Hypo-osmolality and hyponatremia: Secondary | ICD-10-CM

## 2016-04-05 DIAGNOSIS — L8942 Pressure ulcer of contiguous site of back, buttock and hip, stage 2: Secondary | ICD-10-CM

## 2016-04-05 LAB — GLUCOSE, CAPILLARY
Glucose-Capillary: 68 mg/dL (ref 65–99)
Glucose-Capillary: 76 mg/dL (ref 65–99)
Glucose-Capillary: 167 mg/dL — ABNORMAL HIGH (ref 65–99)
Glucose-Capillary: 194 mg/dL — ABNORMAL HIGH (ref 65–99)
Glucose-Capillary: 201 mg/dL — ABNORMAL HIGH (ref 65–99)

## 2016-04-05 LAB — CBC
HCT: 27.6 % — ABNORMAL LOW (ref 36.0–46.0)
HEMATOCRIT: 23.4 % — AB (ref 36.0–46.0)
Hemoglobin: 8.8 g/dL — ABNORMAL LOW (ref 12.0–15.0)
Hemoglobin: 7.4 g/dL — ABNORMAL LOW (ref 12.0–15.0)
MCH: 29.4 pg (ref 26.0–34.0)
MCH: 29.4 pg (ref 26.0–34.0)
MCHC: 31.9 g/dL (ref 30.0–36.0)
MCHC: 31.6 g/dL (ref 30.0–36.0)
MCV: 92.3 fL (ref 78.0–100.0)
MCV: 92.9 fL (ref 78.0–100.0)
PLATELETS: 149 10*3/uL — AB (ref 150–400)
Platelets: 178 10*3/uL (ref 150–400)
RBC: 2.99 MIL/uL — ABNORMAL LOW (ref 3.87–5.11)
RBC: 2.52 MIL/uL — AB (ref 3.87–5.11)
RDW: 16.7 % — AB (ref 11.5–15.5)
RDW: 16.9 % — AB (ref 11.5–15.5)
WBC: 10.6 10*3/uL — ABNORMAL HIGH (ref 4.0–10.5)
WBC: 15.9 10*3/uL — AB (ref 4.0–10.5)

## 2016-04-05 LAB — BASIC METABOLIC PANEL
Anion gap: 4 — ABNORMAL LOW (ref 5–15)
BUN: 22 mg/dL — AB (ref 6–20)
CALCIUM: 7.6 mg/dL — AB (ref 8.9–10.3)
CO2: 24 mmol/L (ref 22–32)
CREATININE: 0.53 mg/dL (ref 0.44–1.00)
Chloride: 104 mmol/L (ref 101–111)
GFR calc Af Amer: >60 mL/min (ref >60)
GLUCOSE: 67 mg/dL (ref 65–99)
POTASSIUM: 3.8 mmol/L (ref 3.5–5.1)
SODIUM: 132 mmol/L — AB (ref 135–145)

## 2016-04-05 LAB — PREPARE RBC (CROSSMATCH)

## 2016-04-05 LAB — PROCALCITONIN: Procalcitonin: 0.3 ng/mL

## 2016-04-05 MED ORDER — HYDROCODONE-ACETAMINOPHEN 7.5-325 MG/15ML PO SOLN
15.0000 mL | ORAL | Status: DC | PRN
  Administered 2016-04-06 – 2016-04-08 (×5): 15 mL
  Filled 2016-04-05 (×6): qty 15

## 2016-04-05 MED ORDER — VANCOMYCIN HCL IN DEXTROSE 1-5 GM/200ML-% IV SOLN
1000.0000 mg | INTRAVENOUS | Status: DC
  Administered 2016-04-05: 1000 mg via INTRAVENOUS
  Filled 2016-04-05 (×2): qty 200

## 2016-04-05 MED ORDER — SODIUM CHLORIDE 0.9 % IV SOLN
Freq: Once | INTRAVENOUS | Status: AC
  Administered 2016-04-05: 23:00:00 via INTRAVENOUS

## 2016-04-05 MED ORDER — FENTANYL CITRATE (PF) 100 MCG/2ML IJ SOLN
25.0000 ug | INTRAMUSCULAR | Status: DC | PRN
  Administered 2016-04-05 – 2016-04-11 (×7): 25 ug via INTRAVENOUS
  Filled 2016-04-05 (×8): qty 2

## 2016-04-05 MED ORDER — INSULIN GLARGINE 100 UNIT/ML ~~LOC~~ SOLN
12.0000 [IU] | Freq: Every day | SUBCUTANEOUS | Status: DC
  Administered 2016-04-05: 12 [IU] via SUBCUTANEOUS
  Filled 2016-04-05 (×3): qty 0.12

## 2016-04-05 MED ORDER — FENTANYL CITRATE (PF) 100 MCG/2ML IJ SOLN
25.0000 ug | Freq: Once | INTRAMUSCULAR | Status: AC
  Administered 2016-04-09: 25 ug via INTRAVENOUS
  Filled 2016-04-05: qty 2

## 2016-04-05 MED ORDER — FENTANYL CITRATE (PF) 100 MCG/2ML IJ SOLN
25.0000 ug | Freq: Once | INTRAMUSCULAR | Status: AC
  Administered 2016-04-05: 25 ug via INTRAVENOUS

## 2016-04-05 MED ORDER — SODIUM CHLORIDE 0.9 % IV BOLUS (SEPSIS)
500.0000 mL | Freq: Once | INTRAVENOUS | Status: AC
  Administered 2016-04-05: 500 mL via INTRAVENOUS

## 2016-04-05 MED ORDER — LIDOCAINE-EPINEPHRINE (PF) 2 %-1:200000 IJ SOLN
20.0000 mL | Freq: Once | INTRAMUSCULAR | Status: DC
  Filled 2016-04-05: qty 20

## 2016-04-05 MED ORDER — DEXTROSE-NACL 5-0.9 % IV SOLN
INTRAVENOUS | Status: DC
  Administered 2016-04-05 (×2): via INTRAVENOUS

## 2016-04-05 MED ORDER — DEXTROSE 50 % IV SOLN
25.0000 mL | Freq: Once | INTRAVENOUS | Status: AC
  Administered 2016-04-05: 25 mL via INTRAVENOUS

## 2016-04-05 MED ORDER — INSULIN ASPART 100 UNIT/ML ~~LOC~~ SOLN
2.0000 [IU] | Freq: Once | SUBCUTANEOUS | Status: AC
  Administered 2016-04-05: 2 [IU] via SUBCUTANEOUS

## 2016-04-05 MED ORDER — LIDOCAINE-EPINEPHRINE 2 %-1:100000 IJ SOLN
20.0000 mL | Freq: Once | INTRAMUSCULAR | Status: DC
  Filled 2016-04-05: qty 20

## 2016-04-05 MED ORDER — FENTANYL CITRATE (PF) 100 MCG/2ML IJ SOLN
INTRAMUSCULAR | Status: AC
  Filled 2016-04-05: qty 2

## 2016-04-05 NOTE — Progress Notes (Signed)
Patient ID: Adrian BlackwaterCatherine Holsinger, female   DOB: May 04, 1932, 82 y.o.   MRN: [id number] Notified by RN of ongoing drainage from wound. Packing removed and there is dark bloody drainage. This is also coming out from around G tube. I repacked the I&D site with Kerlix and replaced the dressing. Will plan to change again in AM. I also adjusted pain med orders.  Violeta GelinasBurke Andres Bantz, MD, MPH, FACS Trauma: [phone] General Surgery: [phone]

## 2016-04-05 NOTE — Progress Notes (Signed)
Paged MD Janee Mornhompson regarding patient's abdomen with great amount of bloody drainage. Also notified him of patient's BP and that CCM gave a 500 cc bolus for that. MD Janee Mornhompson to come to bedside. Will continue to monitor patient.   Horton ChinMacKayla A Bailea Beed, RN

## 2016-04-05 NOTE — Progress Notes (Signed)
eLink Physician-Brief Progress Note Patient Name: Janice BlackwaterCatherine Dolph DOB: [birth year] MRN: [id number]   Date of Service  04/05/2016  HPI/Events of Note  bp soft and hr higer  No distress  AM labs ok  Cam care - stable  eICU Interventions  500cc fluid bolus     Intervention Category Major Interventions: Other:  Janice Watson 04/05/2016, 7:51 PM

## 2016-04-05 NOTE — Progress Notes (Signed)
Pharmacy Antibiotic Note  Janice BlackwaterCatherine Watson is a 82 y.o. female admitted on 03/31/2016 with PNA and started on cefepime based on previous sputum culture data.  Vancomycin was added for abdominal wall cellulitis and cefepime was switched to Zosyn.  CT shows PNA and possible necrotizing fasciitis.  Patient's renal function has been stable.  She is afebrile and her WBC is trending up.   Plan: - Increase vanc to 1gm IV Q24H for new goal trough 15-20 mcg/mL - Continue Zosyn 3.375gm IV Q8H, 4 hr infusion - Monitor renal fxn, clinical progress, micro data, vanc trough at Css   Height: 5\' 1"  (154.9 cm) (per patient) Weight: 122 lb 5.7 oz (55.5 kg) IBW/kg (Calculated) : 47.8  Temp (24hrs), Avg:97.9 F (36.6 C), Min:97 F (36.1 C), Max:98.5 F (36.9 C)   Recent Labs Lab 03/31/16 2052  04/01/16 0044 04/01/16 0350  04/01/16 2200 04/02/16 0415 04/03/16 0420 04/04/16 0503 04/04/16 2232 04/05/16 0523  WBC  --   --   --  10.2  --   --  8.8 8.9 9.9  --  10.6*  CREATININE 0.60  < >  --  0.79  < > 0.75 0.65 0.54 0.46  --  0.53  LATICACIDVEN 1.70  --  2.44*  --   --   --   --   --   --  1.0  --   < > = values in this interval not displayed.  Estimated Creatinine Clearance: 40.9 mL/min (by C-G formula based on SCr of 0.53 mg/dL).    Allergies  Allergen Reactions  . Tape Itching    Antimicrobials this admission:  Cefepime 11/19 >> 11/22 Fluc 11/18 >> PO Vanc 11/18 >>  Vanc 11/22 >> Zosyn 11/22 >>  Dose adjustments this admission:  N/A  Microbiology results:  11/19 Sputum:  mult organisms 11/19 MRSA PCR: negative 11/19, 11/20 UCx - suggest recollection 11/19 BCx x2 - NGTD 11/20 C.diff - positive 11/22 Gtube fluid - GPR / GNR on Gram stain 11/22 abd cellulitis - GNR / GPC on Gram stain   Janice Watson D. Laney Potashang, PharmD, BCPS Pager:  [phone] 04/05/2016, 10:12 AM

## 2016-04-05 NOTE — Procedures (Signed)
Incision and Drainage Procedure Note  Pre-operative Diagnosis: L abdominal wall abscess  Post-operative Diagnosis: same  Indications: above  Anesthesia: 2% lido with epinephrine, fentanyl IV  Procedure Details  Informed consent obtained from her son.  The skin was sterilely prepped and draped over the affected area in the usual fashion. After adequate local anesthesia, I&D with a #11 blade was performed on the L abdominal wall inferior to the G tube site. Bloody purulent fluid expressed and sent for culture. Wound was irrigated out with sterile saline and packed with Iodoform. Sterile dressing applied. The patient was observed until stable.  Findings: above  EBL: 20 cc's  Condition: Tolerated procedure well   Complications: none  Violeta GelinasBurke Angy Swearengin, MD, MPH, FACS Trauma: [phone] General Surgery: [phone] .

## 2016-04-05 NOTE — Progress Notes (Signed)
Trach care done. Site was soiled. Site was cleansed with NS, dried, new clean dressing applied. New IC inserted and locked back into place. On assessment it appears that the patient Trach flange/site is deviated to the right. Possible tracheal deviation. RN is aware of it. Pt is ventilating without complications, currently still on mechanical ventilation. Pt states there is no pain at this time at or around her trach site. RT will monitor patient throughout the night.

## 2016-04-05 NOTE — Progress Notes (Signed)
Patient ID: Adrian BlackwaterCatherine Macnair, female   DOB: Jul 09, 1931, 82 y.o.   MRN: [id number] Notified regarding further wound drainage. Packing removed and wound explored. No obvious ongoing new hemorrhage. New packing placed. Hb down some and BP soft so will TF 1u PRBC. Follow closely.  Violeta GelinasBurke Shaw Dobek, MD, MPH, FACS Trauma: [phone] General Surgery: [phone]

## 2016-04-05 NOTE — Progress Notes (Signed)
Subjective: Awake on vent Objective: Vital signs in last 24 hours: Temp:  [97 F (36.1 C)-98.1 F (36.7 C)] 98.1 F (36.7 C) (11/23 0400) Pulse Rate:  [71-106] 80 (11/23 0600) Resp:  [16-30] 16 (11/23 0600) BP: (88-138)/(53-78) 95/56 (11/23 0600) SpO2:  [97 %-100 %] 100 % (11/23 0753) FiO2 (%):  [40 %] 40 % (11/23 0753) Weight:  [55.5 kg (122 lb 5.7 oz)] 55.5 kg (122 lb 5.7 oz) (11/23 0600) Last BM Date: 04/04/16  Intake/Output from previous day: 11/22 0701 - 11/23 0700 In: 2168 [I.V.:1728; NG/GT:140; IV Piggyback:300] Out: 2725 [Urine:2425; Stool:300] Intake/Output this shift: No intake/output data recorded.  GI: G tube in place, erythema and fluctuance around it - midline wound with granulation  Lab Results:   Recent Labs  04/04/16 0503 04/05/16 0523  WBC 9.9 10.6*  HGB 9.0* 8.8*  HCT 28.5* 27.6*  PLT 141* 149*   BMET  Recent Labs  04/04/16 0503 04/05/16 0523  NA 134* 132*  K 4.7 3.8  CL 105 104  CO2 24 24  GLUCOSE 136* 67  BUN 28* 22*  CREATININE 0.46 0.53  CALCIUM 8.0* 7.6*   PT/INR No results for input(s): LABPROT, INR in the last 72 hours. ABG No results for input(s): PHART, HCO3 in the last 72 hours.  Invalid input(s): PCO2, PO2  Studies/Results: Ct Abdomen W Contrast  Result Date: 04/04/2016 CLINICAL DATA:  Acute onset of left-sided abdominal wall cellulitis. Question of displaced PEG tube. Initial encounter. EXAM: CT ABDOMEN WITH CONTRAST TECHNIQUE: Multidetector CT imaging of the abdomen was performed using the standard protocol following bolus administration of intravenous contrast. CONTRAST:  100 mL ISOVUE-300 IOPAMIDOL (ISOVUE-300) INJECTION 61% COMPARISON:  None. FINDINGS: Lower chest: There is dense consolidation of the left lower lung lobe, raising concern for pneumonia. A rounded opacity at the right middle lobe may reflect a small infectious cavitation. Mild right basilar atelectasis is noted. The visualized portions of the  mediastinum are unremarkable. Contrast is noted within the distal esophagus, reflecting gastroesophageal reflux. Hepatobiliary: The liver is unremarkable in appearance. The gallbladder is within normal limits. The common bile duct remains normal in caliber. Pancreas: A 1.1 cm cystic lesion is noted at the tail of the pancreas. Spleen: The spleen is unremarkable in appearance. Adrenals/Urinary Tract: Scattered calcifications are seen at the adrenal glands bilaterally. The adrenal glands are otherwise unremarkable. Small bilateral renal cysts are seen. The kidneys are otherwise unremarkable. There is no evidence of hydronephrosis. No renal or ureteral stones are identified, though evaluation for stones is limited given contrast in the renal calyces. No perinephric stranding is seen. Stomach/Bowel: A G-tube is noted in expected position, with contrast noted in the stomach. The stomach is otherwise unremarkable in appearance. Visualized small and large bowel loops are grossly unremarkable. Vascular/Lymphatic: Scattered calcification is seen along the abdominal aorta and its branches. No retroperitoneal lymphadenopathy is seen. Other: Diffuse soft tissue air is seen tracking along the left anterior abdominal wall, tracking from the left upper quadrant to the left lower quadrant, with a small amount of associated fluid. Findings raise concern for infection with a gas producing organism and necrotizing fasciitis. Musculoskeletal: No acute osseous abnormalities are identified. There is minimal grade 1 anterolisthesis of L5 on S1, reflecting underlying facet disease. The visualized musculature is unremarkable in appearance. IMPRESSION: 1. Diffuse soft tissue air tracking along the left anterior abdominal wall, extending from the left upper quadrant of the left lower quadrant, with a small amount of associated fluid noted in the  soft tissues. Findings raise concern for infection with a gas producing organism and necrotizing  fasciitis. 2. G-tube noted in expected position. Contrast noted in the stomach. Stomach unremarkable in appearance. 3. Dense consolidation of the left lower lobe, concerning for pneumonia. Rounded opacity at the right middle lobe may reflect a small infectious cavitation, raising concern for atypical infection. Mild right basilar atelectasis noted. 4. Contrast noted within the distal esophagus, reflecting gastroesophageal reflux. 5. Small bilateral renal cysts seen. 6. Scattered aortic atherosclerosis. 7. **An incidental finding of potential clinical significance has been found. 1.1 cm cystic lesion at the tail of the pancreas. Recommend follow up pre and post contrast MRI/MRCP or pancreatic protocol CT in 2 years. This recommendation follows ACR consensus guidelines: Management of Incidental Pancreatic Cysts: A White Paper of the ACR Incidental Findings Committee. J Am Coll Radiol 2017;14:911-923.** These results were called by telephone at the time of interpretation on 04/04/2016 at 9:15 pm to [facility] on [facility], who verbally acknowledged these results. Electronically Signed   By: Roanna RaiderJeffery  Chang M.D.   On: 04/04/2016 21:30   Dg Chest Port 1 View  Result Date: 04/03/2016 CLINICAL DATA:  Increased sputum from tracheostomy EXAM: PORTABLE CHEST 1 VIEW COMPARISON:  03/31/2016 FINDINGS: Tracheostomy tube is again noted in satisfactory position. Right-sided PICC line is again noted in the superior vena cava. The cardiac shadow is stable. Aortic calcifications are again seen and stable. Mild bilateral increased opacities are again seen slightly greater on the left than the right although stable from the prior exam. No bony abnormality is noted. IMPRESSION: Stable bilateral opacities without acute abnormality. Electronically Signed   By: Alcide CleverMark  Lukens M.D.   On: 04/03/2016 13:22    Anti-infectives: Anti-infectives    Start     Dose/Rate Route Frequency Ordered Stop   04/05/16 1000  ceFEPIme (MAXIPIME) 1 g in  dextrose 5 % 50 mL IVPB  Status:  Discontinued     1 g 100 mL/hr over 30 Minutes Intravenous Every 24 hours 04/04/16 1123 04/04/16 1152   04/05/16 0500  piperacillin-tazobactam (ZOSYN) IVPB 3.375 g     3.375 g 12.5 mL/hr over 240 Minutes Intravenous Every 8 hours 04/04/16 2200     04/04/16 2230  piperacillin-tazobactam (ZOSYN) IVPB 3.375 g     3.375 g 100 mL/hr over 30 Minutes Intravenous  Once 04/04/16 2200 04/04/16 2300   04/04/16 2200  clindamycin (CLEOCIN) IVPB 600 mg  Status:  Discontinued     600 mg 100 mL/hr over 30 Minutes Intravenous Every 8 hours 04/04/16 2153 04/04/16 2211   04/04/16 1400  ceFAZolin (ANCEF) IVPB 1 g/50 mL premix  Status:  Discontinued     1 g 100 mL/hr over 30 Minutes Intravenous Every 8 hours 04/04/16 1152 04/04/16 2152   04/04/16 1400  vancomycin (VANCOCIN) IVPB 750 mg/150 ml premix     750 mg 150 mL/hr over 60 Minutes Intravenous Every 24 hours 04/04/16 1252     04/01/16 1200  vancomycin (VANCOCIN) 50 mg/mL oral solution 125 mg  Status:  Discontinued     125 mg Oral Every 6 hours 04/01/16 1040 04/04/16 1147   04/01/16 1200  ceFEPIme (MAXIPIME) 1 g in dextrose 5 % 50 mL IVPB  Status:  Discontinued     1 g 100 mL/hr over 30 Minutes Intravenous Every 12 hours 04/01/16 1130 04/04/16 1123   04/01/16 1000  fluconazole (DIFLUCAN) tablet 200 mg  Status:  Discontinued     200 mg Per Tube Daily 04/01/16 0322  04/02/16 1246   04/01/16 0800  piperacillin-tazobactam (ZOSYN) IVPB 3.375 g  Status:  Discontinued     3.375 g 12.5 mL/hr over 240 Minutes Intravenous Every 8 hours 04/01/16 0200 04/01/16 0729   03/31/16 2330  piperacillin-tazobactam (ZOSYN) IVPB 3.375 g     3.375 g 100 mL/hr over 30 Minutes Intravenous  Once 03/31/16 2320 04/01/16 0220      Assessment/Plan: Abdominal wall abscess around G tube - will I&D at bedside, likely can start TF after ID - Vanc/Zosyn for PNA and above  LOS: 5 days    Janice Watson 04/05/2016

## 2016-04-05 NOTE — Progress Notes (Signed)
Subjective:  CT abdomen showed LLL pneumonia, and air in soft tissue of her abdominal wall that is tender and erythematous.   Patient endorses feeling better overall; her abdominal pain and breathing have remained stable from yesterday. She denies new issues.    Objective:  Vital signs in last 24 hours: Vitals:   04/05/16 0600 04/05/16 0753 04/05/16 0758 04/05/16 0820  BP: (!) 95/56  106/64   Pulse: 80  71   Resp: 16  17   Temp:    98.5 F (36.9 C)  TempSrc:    Oral  SpO2: 100% 100% 100%   Weight: 55.5 kg (122 lb 5.7 oz)     Height:       Constitutional: NAD, VS reviewed CV: RRR, no murmurs, rubs or gallops appreciated, no LE edema, pulses intact Resp: trach in place with sputum and secretions present; mechanical breath sounds present bilaterally, with rhonchi, no wheezing Vent settings: PRVC, FiO2 40%, RR 16, TV 400, peep 5 Abd: G tube in place, soft, +BS, tender over G tube site, erythema and induration over in surround area of G tube that is extending down to LLQ today; wound at ventral hernia repair site with granulation tissue- dressing in place; contact dermatitis surrounding wound site and near G-tube GU: Foley cath in place Neuro: Alert; neuro grossly intact  Assessment/Plan:  Principal Problem:   Hyperkalemia Active Problems:   Anemia   Pressure injury of skin   Ventilator associated pneumonia (HCC)   Hyperglycemia   Hyponatremia   Ventilator dependence (HCC)   UTI due to Klebsiella species   Recurrent colitis due to Clostridium difficile   Cellulitis of abdominal wall  Ventilator Dependent - VAP: CT 11/21 showing LLL consolidation consistent with VAP; sputum gram stain showing G- rods, G+ rods, G+ cocci in pairs. Sputum culture shows multiple organisms present with none predominant. Has had coverage x 5days     --f/u blood cultures no growth x3d --IV zosyn/vanc --duonebs q6hrs --we have consulted pulmonary for assistance in assessing vent need and  possible weaning - appreciate their help  G-tube dependent: Patient with dark drainage from her G tube site yesterday; cuff does not seem to be holding the G tube in place very well. Surgery was consulted for evaluation - CT scan showed contrast follow through bowel ruling out obstruction. Tube feeds were held yesterday and this morning and will likely be restarted after bedside I&D later today. Will support with fluids.   --surgery consulted - appreciate their assistance  Abdominal wall cellulitis: Patient with cellulitis adjacent to her G tube; Bedside U/S did not reveal an abscess, and was consistent with inflammation. CT scan of abdomen shows air within soft tissue of abdominal wall that could be air trapping from g-tube irritation of skin vs necrotizing fasciitis. Surgery evaluated patient and will do bedside I&D.   --IV vanc and IV zosyn for broad coverage in case of bacterial cellulitis  C difficile colonization:  C dif testing showed +antigen, - toxin, and +PCR; patient likely colonized.  --will monitor for recurrence of increased diarrhea and vital sign changes for re-initiating treatement   Complicated UTI: Patient with chronic indwelling catheter. Per chart review, patient has history of Klebsiella UTI and per nursing at [facility] had prelim urine culture at transfer showed K. pneumoniae and was about to be started on ertapenem for this infection. UA on admission showed RBC's WBC, and few bacteria which could be consistent with UTI - she was started on cefepime on admission.  Two urine cultures had too many species to specify source.   --day 5 of broad spectrum antibiotics that would cover her UTI  Hyperkalemia:  Resolved; K this AM is 3.8. ACTH stim test was negative so will be titrating down dexamethasone from 4mg  daily to 2mg  daily x 1 week (day 2).  --f/u AM BMP  Chronic anemia: Stable. FOBT was negative, UA was positive for RBC's likely due to trauma as foley was just changed  prior to study. Will continue to monitor.  --f/u AM CBC  T2DM: Tube feeds have been held due to evaluation of G tube yesterday; patient had two episodes of hypoglycemia (one last night 2hrs after aspart, and early morning CBG today). Will adjust insulin regimen; D5NS IVF until tube feeds are restarted.  -- Lantus 12U qhs -- SSI - s  HLD: -- Continue home Cholestyramine 4 mg QID per tube  GERD: -- Continue home pepcid 20 mg per tube daily   Anxiety: -- Continue home Buspar -- Continue home ativan 0.5 q8hrs prn per tube -- Continue home remeron 15 mg QHS  FEN: NPO for now - restart after bedside I&D - tube feeds per G tube per nutrition, free water 100ml per G tube,  IVF 0.45% NS CODE: DNR DVT PPx: lovenox  Dispo: Anticipated discharge in approximately 2-3 day(s). [facility] We will await surgery recc's and attempt to narrow Abx for infection treatment before transfer as there were new developments today.   Nyra MarketGorica Corisa Montini, MD 04/05/2016, 9:36 AM Pager [phone]

## 2016-04-05 NOTE — Progress Notes (Signed)
  Date: 04/05/2016  Patient name: Janice Watson  Medical record number: [id number]  Date of birth: 09/30/1931   I have personally seen this patient and the plan of care was discussed with the house staff. Please see Dr. Kandice HamsSvalina's note for complete details. I concur with her findings.   Reviewed Surgery note.  Plan for ID around G tube at bedside today.    Inez CatalinaEmily B Malcomb Gangemi, MD 04/05/2016, 11:08 AM

## 2016-04-06 DIAGNOSIS — J9610 Chronic respiratory failure, unspecified whether with hypoxia or hypercapnia: Secondary | ICD-10-CM

## 2016-04-06 DIAGNOSIS — Z95828 Presence of other vascular implants and grafts: Secondary | ICD-10-CM

## 2016-04-06 DIAGNOSIS — Z978 Presence of other specified devices: Secondary | ICD-10-CM

## 2016-04-06 DIAGNOSIS — Z452 Encounter for adjustment and management of vascular access device: Secondary | ICD-10-CM

## 2016-04-06 DIAGNOSIS — A498 Other bacterial infections of unspecified site: Secondary | ICD-10-CM

## 2016-04-06 DIAGNOSIS — R739 Hyperglycemia, unspecified: Secondary | ICD-10-CM

## 2016-04-06 DIAGNOSIS — Z930 Tracheostomy status: Secondary | ICD-10-CM

## 2016-04-06 DIAGNOSIS — K651 Peritoneal abscess: Secondary | ICD-10-CM

## 2016-04-06 DIAGNOSIS — J150 Pneumonia due to Klebsiella pneumoniae: Secondary | ICD-10-CM

## 2016-04-06 DIAGNOSIS — J95851 Ventilator associated pneumonia: Secondary | ICD-10-CM

## 2016-04-06 DIAGNOSIS — Z91048 Other nonmedicinal substance allergy status: Secondary | ICD-10-CM

## 2016-04-06 DIAGNOSIS — J449 Chronic obstructive pulmonary disease, unspecified: Secondary | ICD-10-CM

## 2016-04-06 LAB — BLOOD CULTURE ID PANEL (REFLEXED)
Acinetobacter baumannii: NOT DETECTED
CANDIDA GLABRATA: NOT DETECTED
Candida albicans: NOT DETECTED
Candida krusei: NOT DETECTED
Candida parapsilosis: NOT DETECTED
Candida tropicalis: NOT DETECTED
Carbapenem resistance: DETECTED — AB
ENTEROCOCCUS SPECIES: NOT DETECTED
ESCHERICHIA COLI: NOT DETECTED
Enterobacter cloacae complex: NOT DETECTED
Enterobacteriaceae species: DETECTED — AB
HAEMOPHILUS INFLUENZAE: NOT DETECTED
Klebsiella oxytoca: NOT DETECTED
Klebsiella pneumoniae: DETECTED — AB
LISTERIA MONOCYTOGENES: NOT DETECTED
NEISSERIA MENINGITIDIS: NOT DETECTED
Proteus species: NOT DETECTED
Pseudomonas aeruginosa: NOT DETECTED
SERRATIA MARCESCENS: NOT DETECTED
STAPHYLOCOCCUS AUREUS BCID: NOT DETECTED
STAPHYLOCOCCUS SPECIES: NOT DETECTED
STREPTOCOCCUS AGALACTIAE: NOT DETECTED
STREPTOCOCCUS PNEUMONIAE: NOT DETECTED
STREPTOCOCCUS SPECIES: NOT DETECTED
Streptococcus pyogenes: NOT DETECTED

## 2016-04-06 LAB — CBC
HEMATOCRIT: 27.5 % — AB (ref 36.0–46.0)
Hemoglobin: 9 g/dL — ABNORMAL LOW (ref 12.0–15.0)
MCH: 30.7 pg (ref 26.0–34.0)
MCHC: 32.7 g/dL (ref 30.0–36.0)
MCV: 93.9 fL (ref 78.0–100.0)
PLATELETS: 133 10*3/uL — AB (ref 150–400)
RBC: 2.93 MIL/uL — AB (ref 3.87–5.11)
RDW: 15.9 % — ABNORMAL HIGH (ref 11.5–15.5)
WBC: 14.3 10*3/uL — AB (ref 4.0–10.5)

## 2016-04-06 LAB — POCT I-STAT, CHEM 8
BUN: 23 mg/dL — ABNORMAL HIGH (ref 6–20)
CHLORIDE: 107 mmol/L (ref 101–111)
CREATININE: 0.6 mg/dL (ref 0.44–1.00)
Calcium, Ion: 1.16 mmol/L (ref 1.15–1.40)
GLUCOSE: 224 mg/dL — AB (ref 65–99)
HEMATOCRIT: 21 % — AB (ref 36.0–46.0)
Hemoglobin: 7.1 g/dL — ABNORMAL LOW (ref 12.0–15.0)
POTASSIUM: 3.8 mmol/L (ref 3.5–5.1)
Sodium: 137 mmol/L (ref 135–145)
TCO2: 19 mmol/L (ref 0–100)

## 2016-04-06 LAB — BASIC METABOLIC PANEL
Anion gap: 6 (ref 5–15)
BUN: 28 mg/dL — ABNORMAL HIGH (ref 6–20)
CHLORIDE: 111 mmol/L (ref 101–111)
CO2: 21 mmol/L — ABNORMAL LOW (ref 22–32)
Calcium: 7.3 mg/dL — ABNORMAL LOW (ref 8.9–10.3)
Creatinine, Ser: 0.6 mg/dL (ref 0.44–1.00)
GFR calc non Af Amer: >60 mL/min (ref >60)
Glucose, Bld: 146 mg/dL — ABNORMAL HIGH (ref 65–99)
POTASSIUM: 3.8 mmol/L (ref 3.5–5.1)
SODIUM: 138 mmol/L (ref 135–145)

## 2016-04-06 LAB — CULTURE, BLOOD (ROUTINE X 2): Culture: NO GROWTH

## 2016-04-06 LAB — GLUCOSE, CAPILLARY
GLUCOSE-CAPILLARY: 49 mg/dL — AB (ref 65–99)
GLUCOSE-CAPILLARY: 229 mg/dL — AB (ref 65–99)
GLUCOSE-CAPILLARY: 120 mg/dL — AB (ref 65–99)
GLUCOSE-CAPILLARY: 278 mg/dL — AB (ref 65–99)
Glucose-Capillary: 49 mg/dL — ABNORMAL LOW (ref 65–99)
Glucose-Capillary: 92 mg/dL (ref 65–99)

## 2016-04-06 MED ORDER — VANCOMYCIN HCL IN DEXTROSE 1-5 GM/200ML-% IV SOLN
1000.0000 mg | INTRAVENOUS | Status: DC
  Administered 2016-04-06: 1000 mg via INTRAVENOUS
  Filled 2016-04-06 (×2): qty 200

## 2016-04-06 MED ORDER — VANCOMYCIN 50 MG/ML ORAL SOLUTION
125.0000 mg | Freq: Four times a day (QID) | ORAL | Status: DC
  Administered 2016-04-06 – 2016-04-11 (×17): 125 mg
  Filled 2016-04-06 (×22): qty 2.5

## 2016-04-06 MED ORDER — DEXTROSE 50 % IV SOLN
INTRAVENOUS | Status: AC
  Filled 2016-04-06: qty 50

## 2016-04-06 MED ORDER — DEXTROSE 5 % IV SOLN
1.2500 g | Freq: Three times a day (TID) | INTRAVENOUS | Status: DC
  Administered 2016-04-06 – 2016-04-11 (×16): 1.25 g via INTRAVENOUS
  Filled 2016-04-06 (×27): qty 6

## 2016-04-06 MED ORDER — DEXTROSE 50 % IV SOLN
25.0000 mL | Freq: Once | INTRAVENOUS | Status: AC
  Administered 2016-04-06: 25 mL via INTRAVENOUS

## 2016-04-06 MED ORDER — SODIUM CHLORIDE 0.9 % IV SOLN
INTRAVENOUS | Status: AC
  Administered 2016-04-06: 12:00:00 via INTRAVENOUS

## 2016-04-06 NOTE — Progress Notes (Signed)
Subjective: Pt on vent awake alert  Objective: Vital signs in last 24 hours: Temp:  [97.6 F (36.4 C)-98.8 F (37.1 C)] 98.8 F (37.1 C) (11/24 0807) Pulse Rate:  [46-134] 108 (11/24 0900) Resp:  [15-32] 29 (11/24 0900) BP: (57-123)/(37-72) 103/56 (11/24 0900) SpO2:  [73 %-100 %] 100 % (11/24 0900) FiO2 (%):  [40 %] 40 % (11/24 0900) Last BM Date: 04/04/16  Intake/Output from previous day: 11/23 0701 - 11/24 0700 In: 3696.7 [I.V.:2056.7; Blood:335; NG/GT:455; IV Piggyback:850] Out: 1090 [Urine:1050; Stool:40] Intake/Output this shift: Total I/O In: 390 [I.V.:200; Other:120; NG/GT:70] Out: 240 [Urine:120; Stool:120]  Incision/Wound:GT site abscess open and draining erythema present   Lab Results:   Recent Labs  04/05/16 2055 04/06/16 0400  WBC 15.9* 14.3*  HGB 7.4* 9.0*  HCT 23.4* 27.5*  PLT 178 133*   BMET  Recent Labs  04/05/16 0523 04/05/16 2048 04/06/16 0400  NA 132* 137 138  K 3.8 3.8 3.8  CL 104 107 111  CO2 24  --  21*  GLUCOSE 67 224* 146*  BUN 22* 23* 28*  CREATININE 0.53 0.60 0.60  CALCIUM 7.6*  --  7.3*   PT/INR No results for input(s): LABPROT, INR in the last 72 hours. ABG No results for input(s): PHART, HCO3 in the last 72 hours.  Invalid input(s): PCO2, PO2  Studies/Results: Ct Abdomen W Contrast  Result Date: 04/04/2016 CLINICAL DATA:  Acute onset of left-sided abdominal wall cellulitis. Question of displaced PEG tube. Initial encounter. EXAM: CT ABDOMEN WITH CONTRAST TECHNIQUE: Multidetector CT imaging of the abdomen was performed using the standard protocol following bolus administration of intravenous contrast. CONTRAST:  100 mL ISOVUE-300 IOPAMIDOL (ISOVUE-300) INJECTION 61% COMPARISON:  None. FINDINGS: Lower chest: There is dense consolidation of the left lower lung lobe, raising concern for pneumonia. A rounded opacity at the right middle lobe may reflect a small infectious cavitation. Mild right basilar atelectasis is noted.  The visualized portions of the mediastinum are unremarkable. Contrast is noted within the distal esophagus, reflecting gastroesophageal reflux. Hepatobiliary: The liver is unremarkable in appearance. The gallbladder is within normal limits. The common bile duct remains normal in caliber. Pancreas: A 1.1 cm cystic lesion is noted at the tail of the pancreas. Spleen: The spleen is unremarkable in appearance. Adrenals/Urinary Tract: Scattered calcifications are seen at the adrenal glands bilaterally. The adrenal glands are otherwise unremarkable. Small bilateral renal cysts are seen. The kidneys are otherwise unremarkable. There is no evidence of hydronephrosis. No renal or ureteral stones are identified, though evaluation for stones is limited given contrast in the renal calyces. No perinephric stranding is seen. Stomach/Bowel: A G-tube is noted in expected position, with contrast noted in the stomach. The stomach is otherwise unremarkable in appearance. Visualized small and large bowel loops are grossly unremarkable. Vascular/Lymphatic: Scattered calcification is seen along the abdominal aorta and its branches. No retroperitoneal lymphadenopathy is seen. Other: Diffuse soft tissue air is seen tracking along the left anterior abdominal wall, tracking from the left upper quadrant to the left lower quadrant, with a small amount of associated fluid. Findings raise concern for infection with a gas producing organism and necrotizing fasciitis. Musculoskeletal: No acute osseous abnormalities are identified. There is minimal grade 1 anterolisthesis of L5 on S1, reflecting underlying facet disease. The visualized musculature is unremarkable in appearance. IMPRESSION: 1. Diffuse soft tissue air tracking along the left anterior abdominal wall, extending from the left upper quadrant of the left lower quadrant, with a small amount of associated fluid  noted in the soft tissues. Findings raise concern for infection with a gas  producing organism and necrotizing fasciitis. 2. G-tube noted in expected position. Contrast noted in the stomach. Stomach unremarkable in appearance. 3. Dense consolidation of the left lower lobe, concerning for pneumonia. Rounded opacity at the right middle lobe may reflect a small infectious cavitation, raising concern for atypical infection. Mild right basilar atelectasis noted. 4. Contrast noted within the distal esophagus, reflecting gastroesophageal reflux. 5. Small bilateral renal cysts seen. 6. Scattered aortic atherosclerosis. 7. **An incidental finding of potential clinical significance has been found. 1.1 cm cystic lesion at the tail of the pancreas. Recommend follow up pre and post contrast MRI/MRCP or pancreatic protocol CT in 2 years. This recommendation follows ACR consensus guidelines: Management of Incidental Pancreatic Cysts: A White Paper of the ACR Incidental Findings Committee. J Am Coll Radiol 2017;14:911-923.** These results were called by telephone at the time of interpretation on 04/04/2016 at 9:15 pm to [facility] on MCH-2S, who verbally acknowledged these results. Electronically Signed   By: Roanna Raider M.D.   On: 04/04/2016 21:30    Anti-infectives: Anti-infectives    Start     Dose/Rate Route Frequency Ordered Stop   04/06/16 0930  ceftazidime-avibactam (AVYCAZ) 1.25 g in dextrose 5 % 50 mL IVPB     1.25 g 25 mL/hr over 2 Hours Intravenous Every 8 hours 04/06/16 0920     04/05/16 1100  vancomycin (VANCOCIN) IVPB 1000 mg/200 mL premix  Status:  Discontinued     1,000 mg 200 mL/hr over 60 Minutes Intravenous Every 24 hours 04/05/16 1015 04/06/16 0920   04/05/16 1000  ceFEPIme (MAXIPIME) 1 g in dextrose 5 % 50 mL IVPB  Status:  Discontinued     1 g 100 mL/hr over 30 Minutes Intravenous Every 24 hours 04/04/16 1123 04/04/16 1152   04/05/16 0500  piperacillin-tazobactam (ZOSYN) IVPB 3.375 g  Status:  Discontinued     3.375 g 12.5 mL/hr over 240 Minutes Intravenous Every 8  hours 04/04/16 2200 04/06/16 0920   04/04/16 2230  piperacillin-tazobactam (ZOSYN) IVPB 3.375 g     3.375 g 100 mL/hr over 30 Minutes Intravenous  Once 04/04/16 2200 04/04/16 2300   04/04/16 2200  clindamycin (CLEOCIN) IVPB 600 mg  Status:  Discontinued     600 mg 100 mL/hr over 30 Minutes Intravenous Every 8 hours 04/04/16 2153 04/04/16 2211   04/04/16 1400  ceFAZolin (ANCEF) IVPB 1 g/50 mL premix  Status:  Discontinued     1 g 100 mL/hr over 30 Minutes Intravenous Every 8 hours 04/04/16 1152 04/04/16 2152   04/04/16 1400  vancomycin (VANCOCIN) IVPB 750 mg/150 ml premix  Status:  Discontinued     750 mg 150 mL/hr over 60 Minutes Intravenous Every 24 hours 04/04/16 1252 04/05/16 1015   04/01/16 1200  vancomycin (VANCOCIN) 50 mg/mL oral solution 125 mg  Status:  Discontinued     125 mg Oral Every 6 hours 04/01/16 1040 04/04/16 1147   04/01/16 1200  ceFEPIme (MAXIPIME) 1 g in dextrose 5 % 50 mL IVPB  Status:  Discontinued     1 g 100 mL/hr over 30 Minutes Intravenous Every 12 hours 04/01/16 1130 04/04/16 1123   04/01/16 1000  fluconazole (DIFLUCAN) tablet 200 mg  Status:  Discontinued     200 mg Per Tube Daily 04/01/16 0322 04/02/16 1246   04/01/16 0800  piperacillin-tazobactam (ZOSYN) IVPB 3.375 g  Status:  Discontinued     3.375 g 12.5 mL/hr  over 240 Minutes Intravenous Every 8 hours 04/01/16 0200 04/01/16 0729   03/31/16 2330  piperacillin-tazobactam (ZOSYN) IVPB 3.375 g     3.375 g 100 mL/hr over 30 Minutes Intravenous  Once 03/31/16 2320 04/01/16 0220      Assessment/Plan: s/p  Bedside I/D  Wound care  Drainage lessening    LOS: 6 days    Chantale Leugers A. 04/06/2016

## 2016-04-06 NOTE — Progress Notes (Signed)
  Date: 04/06/2016  Patient name: Janice BlackwaterCatherine Tienda  Medical record number: [id number]  Date of birth: 06/21/1931   I have personally seen and evaluated Ms. Townsel and the plan of care was discussed with the house staff. Please see Dr. Kandice HamsSvalina's note for complete details. I concur with her findings.  Inez CatalinaEmily B Hadriel Northup, MD 04/06/2016, 11:37 AM

## 2016-04-06 NOTE — Progress Notes (Signed)
Pharmacy Antibiotic Note  Janice BlackwaterCatherine Watson is a 82 y.o. female with KPC klebsiella bacteremia, intra-abdominal abscess, pneumonia, and possible Cdiff. Currently being treated with avycaz, now to restart IV vancomycin per ID recommendation. Patient also received treatment with PO vancomycin 11/19-11/22 - per Dr. Daiva EvesVan Watson will resume.  Vancomycin trough goal 15-20  Plan: 1) Vancomycin 1g IV q24 2) Vancomycin 125mg  PO q6  Height: 5\' 1"  (154.9 cm) (per patient) Weight: 122 lb 5.7 oz (55.5 kg) IBW/kg (Calculated) : 47.8  Temp (24hrs), Avg:98.1 F (36.7 C), Min:97.3 F (36.3 C), Max:98.8 F (37.1 C)   Recent Labs Lab 03/31/16 2052  04/01/16 0044  04/03/16 0420 04/04/16 0503 04/04/16 2232 04/05/16 0523 04/05/16 2048 04/05/16 2055 04/06/16 0400  WBC  --   --   --   < > 8.9 9.9  --  10.6*  --  15.9* 14.3*  CREATININE 0.60  < >  --   < > 0.54 0.46  --  0.53 0.60  --  0.60  LATICACIDVEN 1.70  --  2.44*  --   --   --  1.0  --   --   --   --   < > = values in this interval not displayed.  Estimated Creatinine Clearance: 40.9 mL/min (by C-G formula based on SCr of 0.6 mg/dL).    Allergies  Allergen Reactions  . Tape Itching    Antimicrobials this admission: BCID 11/24- Klebsiella (carbapenem resistant) Cefepime 11/19 >> 11/22 Fluc 11/18 >> 11/20 PO Vanc 11/18 >> 11/22, resume 11/24>> Vanc 11/22 >> 11/23, resume 11/24>> Zosyn 11/22 >> 11/24  Dose adjustments this admission: n/a  Microbiology results: 11/19 Sputum:  mult organisms 11/19 MRSA PCR: negative 11/19, 11/20 UCx - suggest recollection 11/19 BCx x2 - GNR 1/2, BCID carbapenem resistant kleb pneumo 11/20 C.diff - positive 11/22 Gtube fluid - mod enterococcus faecalis 11/22 abd cellulitis - rare kleb pneumo, few enterococcus faecalis 11/23 left abdomen abscess - rare kleb pneumo  09/24/15 (per Care Everywhere): Sputum culture - Enterobacter cloacae sensitive to Cefepime, Quinolones, Merrem, Aminoglycosides, and  Septra. Zosyn is not included in this susceptibility report.  Thank you for allowing pharmacy to be a part of this patient's care.  Janice Watson, Janice Watson Janice Watson 04/06/2016 8:36 PM

## 2016-04-06 NOTE — Progress Notes (Signed)
Called e-link to notify of BP systolic <90 after unit of blood and fluid bolus. Spoke to [facility] RN and she is going to speak with MD at [email or web address].  Horton ChinMacKayla A Kirsten Spearing, RN

## 2016-04-06 NOTE — Consult Note (Signed)
Date of Admission:  03/31/2016  Date of Consult:  04/06/2016  Reason for Consult: CARBAPENEM ASEresistant Klebsiella bacteremia Referring Physician: Dr. Daryll Drown   HPI: Janice Watson is an 82 y.o. female with COPD and ventilatory dependent respiratory failure with tracheostomy who [facility] with hyperkalemia and pneumonia fevers. She apparently was being treated for ammonia with Zosyn and also had been on vancomycin orally for Clostridium difficile colitis. She had blood cultures tracheal cultures taken when she arrived here . She also had evidence of cellulitic inflammation in her abdomen near her chronic granulomatous wound, and drainage around her PEG  A CT scan of the abdomen was performed which showed  IMPRESSION: 1. Diffuse soft tissue air tracking along the left anterior abdominal wall, extending from the left upper quadrant of the left lower quadrant, with a small amount of associated fluid noted in the soft tissues. Findings raise concern for infection with a gas producing organism and necrotizing fasciitis. 2. G-tube noted in expected position. Contrast noted in the stomach. Stomach unremarkable in appearance. 3. Dense consolidation of the left lower lobe, concerning for pneumonia. Rounded opacity at the right middle lobe may reflect a small infectious cavitation, raising concern for atypical infection. Mild right basilar atelectasis noted. 4. Contrast noted within the distal esophagus, reflecting gastroesophageal reflux. 5. Small bilateral renal cysts seen. 6. Scattered aortic atherosclerosis. 7. **An incidental finding of potential clinical significance has been found. 1.1 cm cystic lesion at the tail of the pancreas  Her jury performed I&D of the abscess on the 23rd and sent material for culture. This material from that well is largely bloody purulent material.  The interval the patient had been on cefepime and fluconazole  intravenously along with oral vancomycin since then blood cultures have come back positive for a CARBAPENEM ASE using Klebsiella pneumoniae and she has been changed to [name]     Past Medical History:  Diagnosis Date  . Anxiety   . C. difficile colitis   . Chronic anemia   . COPD (chronic obstructive pulmonary disease) ([place])   . GERD (gastroesophageal reflux disease)   . Healthcare-associated pneumonia   . Hypertension   . Neuropathic bladder   . Pneumonia   . Protein calorie malnutrition ([place])   . Takotsubo cardiomyopathy   . Ventilator dependent ([place])     No past surgical history on file.  Social History:  has no tobacco, alcohol, and drug history on file.   No family history on file.  Allergies  Allergen Reactions  . Tape Itching     Medications: I have reviewed patients current medications as documented in Epic Anti-infectives    Start     Dose/Rate Route Frequency Ordered Stop   04/06/16 0930  ceftazidime-avibactam (AVYCAZ) 1.25 g in dextrose 5 % 50 mL IVPB     1.25 g 25 mL/hr over 2 Hours Intravenous Every 8 hours 04/06/16 0920     04/05/16 1100  vancomycin (VANCOCIN) IVPB 1000 mg/200 mL premix  Status:  Discontinued     1,000 mg 200 mL/hr over 60 Minutes Intravenous Every 24 hours 04/05/16 1015 04/06/16 0920   04/05/16 1000  ceFEPIme (MAXIPIME) 1 g in dextrose 5 % 50 mL IVPB  Status:  Discontinued     1 g 100 mL/hr over 30 Minutes Intravenous Every 24 hours 04/04/16 1123 04/04/16 1152   04/05/16 0500  piperacillin-tazobactam (ZOSYN) IVPB 3.375 g  Status:  Discontinued     3.375 g 12.5  mL/hr over 240 Minutes Intravenous Every 8 hours 04/04/16 2200 04/06/16 0920   04/04/16 2230  piperacillin-tazobactam (ZOSYN) IVPB 3.375 g     3.375 g 100 mL/hr over 30 Minutes Intravenous  Once 04/04/16 2200 04/04/16 2300   04/04/16 2200  clindamycin (CLEOCIN) IVPB 600 mg  Status:  Discontinued     600 mg 100 mL/hr over 30 Minutes Intravenous Every 8 hours 04/04/16 2153  04/04/16 2211   04/04/16 1400  ceFAZolin (ANCEF) IVPB 1 g/50 mL premix  Status:  Discontinued     1 g 100 mL/hr over 30 Minutes Intravenous Every 8 hours 04/04/16 1152 04/04/16 2152   04/04/16 1400  vancomycin (VANCOCIN) IVPB 750 mg/150 ml premix  Status:  Discontinued     750 mg 150 mL/hr over 60 Minutes Intravenous Every 24 hours 04/04/16 1252 04/05/16 1015   04/01/16 1200  vancomycin (VANCOCIN) 50 mg/mL oral solution 125 mg  Status:  Discontinued     125 mg Oral Every 6 hours 04/01/16 1040 04/04/16 1147   04/01/16 1200  ceFEPIme (MAXIPIME) 1 g in dextrose 5 % 50 mL IVPB  Status:  Discontinued     1 g 100 mL/hr over 30 Minutes Intravenous Every 12 hours 04/01/16 1130 04/04/16 1123   04/01/16 1000  fluconazole (DIFLUCAN) tablet 200 mg  Status:  Discontinued     200 mg Per Tube Daily 04/01/16 0322 04/02/16 1246   04/01/16 0800  piperacillin-tazobactam (ZOSYN) IVPB 3.375 g  Status:  Discontinued     3.375 g 12.5 mL/hr over 240 Minutes Intravenous Every 8 hours 04/01/16 0200 04/01/16 0729   03/31/16 2330  piperacillin-tazobactam (ZOSYN) IVPB 3.375 g     3.375 g 100 mL/hr over 30 Minutes Intravenous  Once 03/31/16 2320 04/01/16 0220         ROS:as in HPI otherwise remainder of 12 point Review of Systems is negative    Blood pressure 102/64, pulse 92, temperature 98.4 F (36.9 C), temperature source Oral, resp. rate 17, height 5' 1"  (1.549 m), weight 122 lb 5.7 oz (55.5 kg), SpO2 100 %. General: Alert and awake, oriented  Mouthing words and communicating HEENT: anicteric sclera,  EOMI, oropharynx clear and without exudate Cardiovascular: Tachycardic normal r,  no murmur rubs or gallops Pulmonary:  rhonchi Gastrointestinal: soft nontender, nondistended, occult tube with dark watery stool Musculoskeletal: no  clubbing or edema noted bilaterally Skin, soft tissue:   Wounds from today's exam 04/06/2016:  I&D site:    Granulating wound        Neuro: nonfocal, strength and  sensation intact   Results for orders placed or performed during the hospital encounter of 03/31/16 (from the past 48 hour(s))  Glucose, capillary     Status: Abnormal   Collection Time: 04/04/16  4:03 PM  Result Value Ref Range   Glucose-Capillary 143 (H) 65 - 99 mg/dL  Aerobic Culture (superficial specimen)     Status: None (Preliminary result)   Collection Time: 04/04/16  4:08 PM  Result Value Ref Range   Specimen Description FLUID    Special Requests G TUBE DISCHARGE SWAB    Gram Stain      ABUNDANT WBC PRESENT,BOTH PMN AND MONONUCLEAR ABUNDANT GRAM POSITIVE RODS MODERATE GRAM NEGATIVE RODS    Culture CULTURE REINCUBATED FOR BETTER GROWTH    Report Status PENDING   Aerobic Culture (superficial specimen)     Status: None (Preliminary result)   Collection Time: 04/04/16  4:10 PM  Result Value Ref Range   Specimen Description  ABDOMEN    Special Requests       CHECK FOR MRSA SKIN SWAB ON THE SITE OF THE ABDOMEN CELLULITIS   Gram Stain      RARE WBC PRESENT, PREDOMINANTLY MONONUCLEAR RARE GRAM NEGATIVE RODS RARE GRAM POSITIVE RODS    Culture CULTURE REINCUBATED FOR BETTER GROWTH    Report Status PENDING   Lactic acid, plasma     Status: None   Collection Time: 04/04/16 10:32 PM  Result Value Ref Range   Lactic Acid, Venous 1.0 0.5 - 1.9 mmol/L  CK     Status: Abnormal   Collection Time: 04/04/16 11:14 PM  Result Value Ref Range   Total CK 14 (L) 38 - 234 U/L  Glucose, capillary     Status: None   Collection Time: 04/05/16  4:55 AM  Result Value Ref Range   Glucose-Capillary 68 65 - 99 mg/dL  Procalcitonin     Status: None   Collection Time: 04/05/16  5:23 AM  Result Value Ref Range   Procalcitonin 0.30 ng/mL    Comment:        Interpretation: PCT (Procalcitonin) <= 0.5 ng/mL: Systemic infection (sepsis) is not likely. Local bacterial infection is possible. (NOTE)         ICU PCT Algorithm               Non ICU PCT Algorithm    ----------------------------      ------------------------------         PCT < 0.25 ng/mL                 PCT < 0.1 ng/mL     Stopping of antibiotics            Stopping of antibiotics       strongly encouraged.               strongly encouraged.    ----------------------------     ------------------------------       PCT level decrease by               PCT < 0.25 ng/mL       >= 80% from peak PCT       OR PCT 0.25 - 0.5 ng/mL          Stopping of antibiotics                                             encouraged.     Stopping of antibiotics           encouraged.    ----------------------------     ------------------------------       PCT level decrease by              PCT >= 0.25 ng/mL       < 80% from peak PCT        AND PCT >= 0.5 ng/mL            Continuin g antibiotics                                              encouraged.       Continuing antibiotics            encouraged.    ----------------------------     ------------------------------  PCT level increase compared          PCT > 0.5 ng/mL         with peak PCT AND          PCT >= 0.5 ng/mL             Escalation of antibiotics                                          strongly encouraged.      Escalation of antibiotics        strongly encouraged.   CBC     Status: Abnormal   Collection Time: 04/05/16  5:23 AM  Result Value Ref Range   WBC 10.6 (H) 4.0 - 10.5 K/uL   RBC 2.99 (L) 3.87 - 5.11 MIL/uL   Hemoglobin 8.8 (L) 12.0 - 15.0 g/dL   HCT 27.6 (L) 36.0 - 46.0 %   MCV 92.3 78.0 - 100.0 fL   MCH 29.4 26.0 - 34.0 pg   MCHC 31.9 30.0 - 36.0 g/dL   RDW 16.7 (H) 11.5 - 15.5 %   Platelets 149 (L) 150 - 400 K/uL  Basic metabolic panel     Status: Abnormal   Collection Time: 04/05/16  5:23 AM  Result Value Ref Range   Sodium 132 (L) 135 - 145 mmol/L   Potassium 3.8 3.5 - 5.1 mmol/L    Comment: DELTA CHECK NOTED   Chloride 104 101 - 111 mmol/L   CO2 24 22 - 32 mmol/L   Glucose, Bld 67 65 - 99 mg/dL   BUN 22 (H) 6 - 20 mg/dL   Creatinine, Ser 0.53  0.44 - 1.00 mg/dL   Calcium 7.6 (L) 8.9 - 10.3 mg/dL   GFR calc non Af Amer >60 >60 mL/min   GFR calc Af Amer >60 >60 mL/min    Comment: (NOTE) The eGFR has been calculated using the CKD EPI equation. This calculation has not been validated in all clinical situations. eGFR's persistently <60 mL/min signify possible Chronic Kidney Disease.    Anion gap 4 (L) 5 - 15  Glucose, capillary     Status: None   Collection Time: 04/05/16  8:11 AM  Result Value Ref Range   Glucose-Capillary 76 65 - 99 mg/dL  Glucose, capillary     Status: Abnormal   Collection Time: 04/05/16 12:22 PM  Result Value Ref Range   Glucose-Capillary 167 (H) 65 - 99 mg/dL   Comment 1 Capillary Specimen    Comment 2 Notify RN   Aerobic Culture (superficial specimen)     Status: None (Preliminary result)   Collection Time: 04/05/16  1:50 PM  Result Value Ref Range   Specimen Description ABSCESS LEFT ABDOMEN    Special Requests LEFT ABDOMINAL WALL ABSCESS    Gram Stain      FEW WBC PRESENT, PREDOMINANTLY PMN NO ORGANISMS SEEN    Culture RARE GRAM NEGATIVE RODS    Report Status PENDING   Glucose, capillary     Status: Abnormal   Collection Time: 04/05/16  4:32 PM  Result Value Ref Range   Glucose-Capillary 194 (H) 65 - 99 mg/dL   Comment 1 Capillary Specimen    Comment 2 Notify RN   I-STAT, chem 8     Status: Abnormal   Collection Time: 04/05/16  8:48 PM  Result Value Ref Range   Sodium  137 135 - 145 mmol/L   Potassium 3.8 3.5 - 5.1 mmol/L   Chloride 107 101 - 111 mmol/L   BUN 23 (H) 6 - 20 mg/dL   Creatinine, Ser 0.60 0.44 - 1.00 mg/dL   Glucose, Bld 224 (H) 65 - 99 mg/dL   Calcium, Ion 1.16 1.15 - 1.40 mmol/L   TCO2 19 0 - 100 mmol/L   Hemoglobin 7.1 (L) 12.0 - 15.0 g/dL   HCT 21.0 (L) 36.0 - 46.0 %  CBC     Status: Abnormal   Collection Time: 04/05/16  8:55 PM  Result Value Ref Range   WBC 15.9 (H) 4.0 - 10.5 K/uL   RBC 2.52 (L) 3.87 - 5.11 MIL/uL   Hemoglobin 7.4 (L) 12.0 - 15.0 g/dL   HCT  23.4 (L) 36.0 - 46.0 %   MCV 92.9 78.0 - 100.0 fL   MCH 29.4 26.0 - 34.0 pg   MCHC 31.6 30.0 - 36.0 g/dL   RDW 16.9 (H) 11.5 - 15.5 %   Platelets 178 150 - 400 K/uL  Prepare RBC     Status: None   Collection Time: 04/05/16  9:41 PM  Result Value Ref Range   Order Confirmation ORDER PROCESSED BY BLOOD BANK   Type and screen [place]     Status: None (Preliminary result)   Collection Time: 04/05/16  9:41 PM  Result Value Ref Range   ABO/RH(D) B NEG    Antibody Screen NEG    Sample Expiration 04/08/2016    Unit Number [id number]    Blood Component Type RED CELLS,LR    Unit division 00    Status of Unit ALLOCATED    Transfusion Status OK TO TRANSFUSE    Crossmatch Result Compatible    Unit Number [id number]    Blood Component Type RED CELLS,LR    Unit division 00    Status of Unit ISSUED,FINAL    Transfusion Status OK TO TRANSFUSE    Crossmatch Result Compatible   Glucose, capillary     Status: Abnormal   Collection Time: 04/05/16  9:56 PM  Result Value Ref Range   Glucose-Capillary 201 (H) 65 - 99 mg/dL   Comment 1 Capillary Specimen    Comment 2 Notify RN   Basic metabolic panel     Status: Abnormal   Collection Time: 04/06/16  4:00 AM  Result Value Ref Range   Sodium 138 135 - 145 mmol/L   Potassium 3.8 3.5 - 5.1 mmol/L   Chloride 111 101 - 111 mmol/L   CO2 21 (L) 22 - 32 mmol/L   Glucose, Bld 146 (H) 65 - 99 mg/dL   BUN 28 (H) 6 - 20 mg/dL   Creatinine, Ser 0.60 0.44 - 1.00 mg/dL   Calcium 7.3 (L) 8.9 - 10.3 mg/dL   GFR calc non Af Amer >60 >60 mL/min   GFR calc Af Amer >60 >60 mL/min    Comment: (NOTE) The eGFR has been calculated using the CKD EPI equation. This calculation has not been validated in all clinical situations. eGFR's persistently <60 mL/min signify possible Chronic Kidney Disease.    Anion gap 6 5 - 15  CBC     Status: Abnormal   Collection Time: 04/06/16  4:00 AM  Result Value Ref Range   WBC 14.3 (H) 4.0 - 10.5 K/uL     RBC 2.93 (L) 3.87 - 5.11 MIL/uL   Hemoglobin 9.0 (L) 12.0 - 15.0 g/dL   HCT 27.5 (L) 36.0 -  46.0 %   MCV 93.9 78.0 - 100.0 fL   MCH 30.7 26.0 - 34.0 pg   MCHC 32.7 30.0 - 36.0 g/dL   RDW 15.9 (H) 11.5 - 15.5 %   Platelets 133 (L) 150 - 400 K/uL  Glucose, capillary     Status: Abnormal   Collection Time: 04/06/16  8:21 AM  Result Value Ref Range   Glucose-Capillary 120 (H) 65 - 99 mg/dL   Comment 1 Capillary Specimen    Comment 2 Notify RN    Comment 3 Document in Chart   Glucose, capillary     Status: Abnormal   Collection Time: 04/06/16 12:15 PM  Result Value Ref Range   Glucose-Capillary 278 (H) 65 - 99 mg/dL   Comment 1 Capillary Specimen    Comment 2 Notify RN    Comment 3 Document in Chart    @BRIEFLABTABLE (sdes,specrequest,cult,reptstatus)   ) Recent Results (from the past 720 hour(s))  Culture, respiratory (NON-Expectorated)     Status: None   Collection Time: 04/01/16  6:46 AM  Result Value Ref Range Status   Specimen Description SPUTUM  Final   Special Requests NONE  Final   Gram Stain   Final    ABUNDANT WBC PRESENT, PREDOMINANTLY PMN ABUNDANT GRAM NEGATIVE RODS FEW GRAM POSITIVE RODS RARE GRAM POSITIVE COCCI IN PAIRS    Culture MULTIPLE ORGANISMS PRESENT, NONE PREDOMINANT  Final   Report Status 04/03/2016 FINAL  Final  MRSA PCR Screening     Status: None   Collection Time: 04/01/16  7:42 AM  Result Value Ref Range Status   MRSA by PCR NEGATIVE NEGATIVE Final    Comment:        The GeneXpert MRSA Assay (FDA approved for NASAL specimens only), is one component of a comprehensive MRSA colonization surveillance program. It is not intended to diagnose MRSA infection nor to guide or monitor treatment for MRSA infections.   Culture, blood (routine x 2)     Status: None (Preliminary result)   Collection Time: 04/01/16 10:46 AM  Result Value Ref Range Status   Specimen Description BLOOD PICC LINE  Final   Special Requests BOTTLES DRAWN AEROBIC AND  ANAEROBIC 5CC  Final   Culture  Setup Time   Final    GRAM NEGATIVE RODS AEROBIC BOTTLE ONLY CRITICAL RESULT CALLED TO, READ BACK BY AND VERIFIED WITH: T STONE,PHARMD AT 0909 04/06/16 BY L BENFIELD    Culture GRAM NEGATIVE RODS  Final   Report Status PENDING  Incomplete  Blood Culture ID Panel (Reflexed)     Status: Abnormal   Collection Time: 04/01/16 10:46 AM  Result Value Ref Range Status   Enterococcus species NOT DETECTED NOT DETECTED Final   Listeria monocytogenes NOT DETECTED NOT DETECTED Final   Staphylococcus species NOT DETECTED NOT DETECTED Final   Staphylococcus aureus NOT DETECTED NOT DETECTED Final   Streptococcus species NOT DETECTED NOT DETECTED Final   Streptococcus agalactiae NOT DETECTED NOT DETECTED Final   Streptococcus pneumoniae NOT DETECTED NOT DETECTED Final   Streptococcus pyogenes NOT DETECTED NOT DETECTED Final   Acinetobacter baumannii NOT DETECTED NOT DETECTED Final   Enterobacteriaceae species DETECTED (A) NOT DETECTED Final    Comment: CRITICAL RESULT CALLED TO, READ BACK BY AND VERIFIED WITH: T STONE,PHARMD AT 0909 04/06/16 BY L BENFIELD    Enterobacter cloacae complex NOT DETECTED NOT DETECTED Final   Escherichia coli NOT DETECTED NOT DETECTED Final   Klebsiella oxytoca NOT DETECTED NOT DETECTED Final   Klebsiella pneumoniae  DETECTED (A) NOT DETECTED Final    Comment: CRITICAL RESULT CALLED TO, READ BACK BY AND VERIFIED WITH: T STONE,PHARMD AT 0909 04/06/16 BY L BENFIELD    Proteus species NOT DETECTED NOT DETECTED Final   Serratia marcescens NOT DETECTED NOT DETECTED Final   Carbapenem resistance DETECTED (A) NOT DETECTED Final    Comment: CRITICAL RESULT CALLED TO, READ BACK BY AND VERIFIED WITH: T STONE,PHARMD AT 0909 04/06/16 BY L BENFIELD    Haemophilus influenzae NOT DETECTED NOT DETECTED Final   Neisseria meningitidis NOT DETECTED NOT DETECTED Final   Pseudomonas aeruginosa NOT DETECTED NOT DETECTED Final   Candida albicans NOT DETECTED  NOT DETECTED Final   Candida glabrata NOT DETECTED NOT DETECTED Final   Candida krusei NOT DETECTED NOT DETECTED Final   Candida parapsilosis NOT DETECTED NOT DETECTED Final   Candida tropicalis NOT DETECTED NOT DETECTED Final  Culture, blood (routine x 2)     Status: None (Preliminary result)   Collection Time: 04/01/16 10:58 AM  Result Value Ref Range Status   Specimen Description BLOOD LEFT HAND  Final   Special Requests IN PEDIATRIC BOTTLE 3CC  Final   Culture NO GROWTH 4 DAYS  Final   Report Status PENDING  Incomplete  Culture, Urine     Status: Abnormal   Collection Time: 04/01/16 12:00 PM  Result Value Ref Range Status   Specimen Description URINE, CATHETERIZED  Final   Special Requests NONE  Final   Culture MULTIPLE SPECIES PRESENT, SUGGEST RECOLLECTION (A)  Final   Report Status 04/02/2016 FINAL  Final  C difficile quick scan w PCR reflex     Status: Abnormal   Collection Time: 04/02/16  8:25 AM  Result Value Ref Range Status   C Diff antigen POSITIVE (A) NEGATIVE Final   C Diff toxin NEGATIVE NEGATIVE Final   C Diff interpretation Results are indeterminate. See PCR results.  Final  Clostridium Difficile by PCR     Status: Abnormal   Collection Time: 04/02/16  8:25 AM  Result Value Ref Range Status   Toxigenic C Difficile by pcr POSITIVE (A) NEGATIVE Final    Comment: Positive for toxigenic C. difficile with little to no toxin production. Only treat if clinical presentation suggests symptomatic illness.  Culture, Urine     Status: Abnormal   Collection Time: 04/02/16  5:17 PM  Result Value Ref Range Status   Specimen Description URINE, CATHETERIZED  Final   Special Requests Cefepime  Final   Culture MULTIPLE SPECIES PRESENT, SUGGEST RECOLLECTION (A)  Final   Report Status 04/04/2016 FINAL  Final  Aerobic Culture (superficial specimen)     Status: None (Preliminary result)   Collection Time: 04/04/16  4:08 PM  Result Value Ref Range Status   Specimen Description FLUID   Final   Special Requests G TUBE DISCHARGE SWAB  Final   Gram Stain   Final    ABUNDANT WBC PRESENT,BOTH PMN AND MONONUCLEAR ABUNDANT GRAM POSITIVE RODS MODERATE GRAM NEGATIVE RODS    Culture CULTURE REINCUBATED FOR BETTER GROWTH  Final   Report Status PENDING  Incomplete  Aerobic Culture (superficial specimen)     Status: None (Preliminary result)   Collection Time: 04/04/16  4:10 PM  Result Value Ref Range Status   Specimen Description ABDOMEN  Final   Special Requests   Final     CHECK FOR MRSA SKIN SWAB ON THE SITE OF THE ABDOMEN CELLULITIS   Gram Stain   Final    RARE WBC PRESENT,  PREDOMINANTLY MONONUCLEAR RARE GRAM NEGATIVE RODS RARE GRAM POSITIVE RODS    Culture CULTURE REINCUBATED FOR BETTER GROWTH  Final   Report Status PENDING  Incomplete  Aerobic Culture (superficial specimen)     Status: None (Preliminary result)   Collection Time: 04/05/16  1:50 PM  Result Value Ref Range Status   Specimen Description ABSCESS LEFT ABDOMEN  Final   Special Requests LEFT ABDOMINAL WALL ABSCESS  Final   Gram Stain   Final    FEW WBC PRESENT, PREDOMINANTLY PMN NO ORGANISMS SEEN    Culture RARE GRAM NEGATIVE RODS  Final   Report Status PENDING  Incomplete     Impression/Recommendation  Principal Problem:   Hyperkalemia Active Problems:   Anemia   Pressure injury of skin   Ventilator associated pneumonia (HCC)   Hyperglycemia   Hyponatremia   Ventilator dependence (HCC)   UTI due to Klebsiella species   Recurrent colitis due to Clostridium difficile   Abdominal wall cellulitis   Janice Watson is a 82 y.o. female with  VDRF, COPD, abdominal abscess evidence for ventral or associated pneumonia and possibly Clostridium difficile, now found to have K PC klebsiella pneumonia bacteremia  #1 K PC klebsiella bacteremia:  Continue AVYCAZ, try to see if the confines visit sensitivities to this antibiotic Remove PICC line  #2 intra-abdominal abscess status post incision  and debridement: Continue AVYCAZ, up culture results  #3 ventilator associated pneumonia: Hopefully if she has a pneumonia this is can be covered by her AVYCAZ, will follow-up her tracheal aspirate cultures though these could also reveal contaminants in addition to true pathogens  #4 possible Clostridium difficile colitis her assay is not definitive but she was artery on treatment for vancomycin started continue her on oral vancomycin    04/06/2016, 2:54 PM   Thank you so much for this interesting consult  [place] [facility] [place] [phone] (pager) [phone] (office) 04/06/2016, 2:54 PM  [place] 04/06/2016, 2:54 PM

## 2016-04-06 NOTE — Care Management Note (Signed)
Case Management Note  Patient Details  Name: Janice Watson MRN: [id number] Date of Birth: 26-Dec-1931  Subjective/Objective:    Pt admitted with  Hyperkalemia and change in mental status        Action/Plan:   PTA from [facility] or SNF.  CM has placed call to [facility] resource to determine which part of facility pt resides in.  [place] [place] [place] of this year.  Pt has a son that is active in pts care.  CM will continue to follow for discharge needs   Expected Discharge Date:                  Expected Discharge Plan:  Long Term Acute Care (LTAC)  In-House Referral:     Discharge planning Services  CM Consult  Post Acute Care Choice:    Choice offered to:     DME Arranged:    DME Agency:     HH Arranged:    HH Agency:     Status of Service:  In process, will continue to follow  If discussed at Long Length of Stay Meetings, dates discussed:    Additional Comments: 04/06/2016  Per attending; Anticipated discharge in approximately 2-3 day(s). [facility]. With new evidence for resistant Klebsiella bacteremia and acute bleed, she will need further treatment and monitoring before she is stable for d/c.    04/03/16 CM contacted by Dr Benjiman CoreSvlani - CM [facility] and restrictions with [facility] due to medicare days.  Dr acknowledged and discussed it with attending - service is not comfortable at this time with discharging pt due to they are actively treating pt and have been unable to obtain medical records from [facility] and prior facility.  CM contacted [facility] liaison to facilitate record transfer.  CM spoke with son Janice Watson and shared LTACH recommendation and explained that [facility] can not offer pt bed at this time.  Son  is in agreement [facility].    CM has text paged attending service multiple times - CM text  paged Dr Oswaldo DoneVincent and received call back - Dr Oswaldo DoneVincent will relay message to attending group.  CSW made aware of pt being from [facility] with tentative referral  CM confirmed that pt is from [facility] SNF - CM spoke with physician advisor and pt is actually appropriate for LTACH. Referral made to both agencies - [facility] informed CM that agency can not offer pt a bed at this time - [facility] is able to offer pt bed on LTACH side.  CM contacted family to discuss LTACH as a discharge option, and also contacted attending to inquire about discharge readiness. [name], [name], RN 04/06/2016, 2:50 PM

## 2016-04-06 NOTE — Progress Notes (Signed)
PHARMACY - PHYSICIAN COMMUNICATION CRITICAL VALUE ALERT - BLOOD CULTURE IDENTIFICATION (BCID)  Results for orders placed or performed during the hospital encounter of 03/31/16  Blood Culture ID Panel (Reflexed) (Collected: 04/01/2016 10:46 AM)  Result Value Ref Range   Enterococcus species NOT DETECTED NOT DETECTED   Listeria monocytogenes NOT DETECTED NOT DETECTED   Staphylococcus species NOT DETECTED NOT DETECTED   Staphylococcus aureus NOT DETECTED NOT DETECTED   Streptococcus species NOT DETECTED NOT DETECTED   Streptococcus agalactiae NOT DETECTED NOT DETECTED   Streptococcus pneumoniae NOT DETECTED NOT DETECTED   Streptococcus pyogenes NOT DETECTED NOT DETECTED   Acinetobacter baumannii NOT DETECTED NOT DETECTED   Enterobacteriaceae species DETECTED (A) NOT DETECTED   Enterobacter cloacae complex NOT DETECTED NOT DETECTED   Escherichia coli NOT DETECTED NOT DETECTED   Klebsiella oxytoca NOT DETECTED NOT DETECTED   Klebsiella pneumoniae DETECTED (A) NOT DETECTED   Proteus species NOT DETECTED NOT DETECTED   Serratia marcescens NOT DETECTED NOT DETECTED   Carbapenem resistance DETECTED (A) NOT DETECTED   Haemophilus influenzae NOT DETECTED NOT DETECTED   Neisseria meningitidis NOT DETECTED NOT DETECTED   Pseudomonas aeruginosa NOT DETECTED NOT DETECTED   Candida albicans NOT DETECTED NOT DETECTED   Candida glabrata NOT DETECTED NOT DETECTED   Candida krusei NOT DETECTED NOT DETECTED   Candida parapsilosis NOT DETECTED NOT DETECTED   Candida tropicalis NOT DETECTED NOT DETECTED    Name of physician (or Provider) Contacted: Dr. Samuella CotaSvalina  Changes to prescribed antibiotics required: Currently on vancomycin and zosyn. Due to detection of KPC, recommend treating with Ceftazidime/Avibactam 1.25 g IV q8h and consulting infectious diseases.  Casilda Carlsaylor Murle Hellstrom, PharmD. PGY-2 Infectious Diseases Pharmacy Resident Pager: [phone] 04/06/2016  9:10 AM

## 2016-04-06 NOTE — Discharge Summary (Signed)
Name: Janice Watson MRN: [id number] DOB: October 30, 1931 82 y.o. PCP: No primary care provider on file.  Date of Admission: 03/31/2016  7:40 PM Date of Discharge: 04/11/2016 Attending Physician: Levert Feinstein, MD  Discharge Diagnosis: Carbapenem resistant K pneumoniae bacteremia Principal Problem:   Hyperkalemia Active Problems:   Anemia   Pressure injury of skin   Ventilator associated pneumonia (HCC)   Hyperglycemia   Hyponatremia   Ventilator dependence (HCC)   UTI due to Klebsiella species   Recurrent colitis due to Clostridium difficile   Abdominal wall cellulitis   PICC (peripherally inserted central catheter) in place   Infection due to carbapenemase producing organism   Pneumonia of right lower lobe due to Klebsiella pneumoniae (HCC)   Abnormal amount of sputum   HCAP (healthcare-associated pneumonia)   VRE (vancomycin-resistant Enterococci) infection   Septicemia due to Klebsiella pneumoniae (HCC)   Abscess of abdominal wall   Idiopathic hypotension   Acute respiratory failure with hypoxia (HCC)   Gastrostomy complication (HCC)   Discharge Medications:   Medication List    STOP taking these medications   cholestyramine 4 g packet Commonly known as:  QUESTRAN   dexamethasone 4 MG tablet Commonly known as:  DECADRON   ertapenem 1 g in sodium chloride 0.9 % 50 mL   fluconazole 200 MG tablet Commonly known as:  DIFLUCAN   heparin 5000 UNIT/ML injection   insulin lispro 100 UNIT/ML injection Commonly known as:  HUMALOG   loperamide 2 MG tablet Commonly known as:  IMODIUM A-D   oxyCODONE-acetaminophen 5-325 MG tablet Commonly known as:  PERCOCET/ROXICET     TAKE these medications   busPIRone 10 MG tablet Commonly known as:  BUSPAR Place 10 mg into feeding tube 3 (three) times daily.   ceftazidime-avibactam 1.25 g in dextrose 5 % 50 mL Inject 1.25 g into the vein every 8 (eight) hours.   chlorhexidine 0.12 % solution Commonly known  as:  PERIDEX Use as directed 15 mLs in the mouth or throat 2 (two) times daily.   famotidine 20 MG tablet Commonly known as:  PEPCID Place 20 mg into feeding tube daily.   feeding supplement (GLUCERNA 1.2 CAL) Liqd Place 1,000 mLs into feeding tube continuous.   Gerhardt's butt cream Crea Apply 1 application topically 4 (four) times daily.   hydrocortisone 5 MG tablet Commonly known as:  CORTEF Take 1 tablet (5 mg total) by mouth daily. 5mg  daily at 1400.   hydrocortisone 5 MG tablet Commonly known as:  CORTEF Take 3 tablets (15 mg total) by mouth daily. 15mg  daily at 0700. Start taking on:  04/12/2016   insulin glargine 100 UNIT/ML injection Commonly known as:  LANTUS Inject 0.03 mLs (3 Units total) into the skin at bedtime. What changed:  how much to take   ipratropium-albuterol 0.5-2.5 (3) MG/3ML Soln Commonly known as:  DUONEB Take 3 mLs by nebulization every 6 (six) hours.   LORazepam 0.5 MG tablet Commonly known as:  ATIVAN Place 0.5 mg into feeding tube every 8 (eight) hours.   mirtazapine 15 MG tablet Commonly known as:  REMERON Place 15 mg into feeding tube at bedtime.   multivitamin Liqd Place 15 mLs into feeding tube daily. Start taking on:  04/12/2016   terbinafine 1 % cream Commonly known as:  LAMISIL Apply 1 application topically 2 (two) times daily.   tigecycline 50 mg in sodium chloride 0.9 % 100 mL Inject 50 mg into the vein every 12 (twelve) hours.   traMADol  50 MG tablet Commonly known as:  ULTRAM Place 25 mg into feeding tube 2 (two) times daily.   vancomycin 50 mg/mL oral solution Commonly known as:  VANCOCIN Place 2.5 mLs (125 mg total) into feeding tube every 6 (six) hours. What changed:  medication strength  how much to take  when to take this       Disposition and follow-up:   Ms.Greenley Hert [facility].  At the hospital follow up visit please address:  1.     C diff colitis - Vancomycin 125mg  qid per peg - end date 04/21/2016 Carbapenem resistant K pneumoniae bacteremia - avycaz 1.25g q8hrs - end date 04/21/2016 CRE K pneumoniae and amp S VRE abdominal abscess - avycaz 1.25g q8hrs and tigecycline 50mg  q 12hrs - end date 04/21/2016 DM -Lantus 3U qhs, SSI Adrenal insufficiency: -was tapered off decadron, started on physiologic hydrocortisone 15mg  at 0700 and 5mg  at 1400.  Abdominal abscess: -BID wet to dry dressing with packing  2.  Labs / imaging needed at time of follow-up: BMP for electrolyte monitoring, CBC for anemia and wbc trending  3.  Pending labs/ test needing follow-up:  --Blood cultures 04/07/2016 - NGTD x 3 days on 04/11/2016 --**An incidental finding of potential clinical significance has been found. 1.1 cm cystic lesion at the tail of the pancreas. Recommend follow up pre and post contrast MRI/MRCP or pancreatic protocol CT in 2 years. This recommendation follows ACR consensus guidelines: Management of Incidental Pancreatic Cysts: A White Paper of the ACR Incidental Findings Committee. J Am Coll Radiol 2017;14:911-923.**  Follow-up Appointments:   Hospital Course by problem list: Principal Problem:   Hyperkalemia Active Problems:   Anemia   Pressure injury of skin   Ventilator associated pneumonia (HCC)   Hyperglycemia   Hyponatremia   Ventilator dependence (HCC)   UTI due to Klebsiella species   Recurrent colitis due to Clostridium difficile   Abdominal wall cellulitis   PICC (peripherally inserted central catheter) in place   Infection due to carbapenemase producing organism   Pneumonia of right lower lobe due to Klebsiella pneumoniae (HCC)   Abnormal amount of sputum   HCAP (healthcare-associated pneumonia)   VRE (vancomycin-resistant Enterococci) infection   Septicemia due to Klebsiella pneumoniae (HCC)   Abscess of abdominal wall   Idiopathic hypotension   Acute respiratory failure with hypoxia (HCC)    Gastrostomy complication (HCC)   CRE K pneumonia bacteremia: Patient found to have CRE k pneumoniae bacteremia on initial blood cultures at admission. Up until that time, she had been on cefepime; this was switched to [facility] - Llanovycaz for proper coverage. Patient's PICC line which she came in on was taken out and a peripheral line was started. Bacteremia could have been due to either UTI or abdominal abscess as both places have grown the bacterium. We [facility] and they confirmed it is susceptible to [employer].Treatment course is for 2 weeks after site control (assuming abdominal abscess). End date is 04/21/2016.   CRE K pneumonia and VRE E coli abdominal abscess: Patient was found to have a cellulitis on her abdomen in close proximity to her G tube on admission. She was initially placed on broad spectrum antibiotics at admission as we did not have info about her UTI pathogen that was found at [facility]. The cellulitis had spread despite antibiotic coverage. A CT scan of her abdomen was performed and indicated air pockets that  were concerning for abscess and/or a necrotizing fascitis infection. Surgery was consulted and they performed a bedside I&D and sent purulent fluid for culture. The wound was packed and monitored by surgery. Cultures grew CRE K pneumo and VRE E coli that was ampicillin sensitive. In addition to the Avycaz she was started on Tigacyl to treat the VRE as ampicillin in conjunction with ESBL in avycaz can decrease seizure threshold. Three days after initial I&D a new suspicious site next to the original wound was I&D revealing some more purulent fluid and a large hematoma. It was thought to be an extension of the original site despite thorough debridement and irrigation on initial procedure. Patient's G tube was evaluated with fluoroscopy for a leak that could be causing entry of tube feeds into abdominal wall, but the study was negative. Her  abdomen is improving with decreased tenderness and erythema. Surgery suggests BID wet to dry dressing changes in addition to regular packing. Her WBC at discharge is 8.1 and she has remained afebrile throughout her hospitalization. We will continue Avycaz and Tigacyl for 2 weeks after site control - end date is 04/21/2016.   VAP: Patient with suspected VAP on admission due to thorough workup in our ED as to cause of altered mental status. We did not have CXR to compare to one we obtained. On admission there was suggestion of right sided infiltrate that could also be scarring from her previous aspiration pneumonia and CPR after cardiac arrest. She was afebrile and WBC was stable; she did not have increased shortness of breath and did not necessitate change in her vent settings. Sputum culture was obtained, however grew multiple species with none predominant. She has been covered for VAP adequately as we have been treating the other infections.   Acute on chronic anemia: Patient with chronic anemia, likely from chronic disease. At admission, she had an initial Hgb of 5.4 and received 1 unit of PRBC's. On recheck her Hgb was 12; initial is likely from lab error due to inadequate flushing of PICC line. After her first I&D, patient had a Hgb drop to 7.4 with hypotension due to bleeding into the wound site; she responded well to 1 unit PRBCs and bleeding was controlled with help of our surgery collegues. Day of second I&D she had a second drop before finding of second site of abscess and hematoma, that again responded to 1 U PRBCs. At discharge, her Hgb is 8.1. It is expected for her to have a decreased bone marrow reaction to produce new RBC's insetting of her chronic illness, and bacteremia.   C diff colitis: Patient with reported increase in diarrhea and positive C diff testing in her SNF before transfer with treatment with vanc per tube. Her stool was tested again at admission, showing + antigen, - toxin, and  + PCR. This is indeterminate but could be a false indeterminate due to patient already being on treatment. She was continued on vanc per tube, and the end date per our ID physician is 04/21/2016. As discussed below, her cholestyramine was discontinued due to increased risk of toxic colon in setting of C diff infection.  K pneumoniae UTI: Patient with reported K pneumonia UTI at SNF. At admission, UA indicated UTI, but cultures (though repeated twice) had grown too many species. At that time we had received info from [facility] that she had a preliminary result of K pneumo in urine culture. She has had previous such UTI's all of them being pan-sensitive. Up until receiving  blood cultures and sensitivities back she had been on zocyn and cefepime before the switch to Avycaz.   T2DM: Patient with T2DM who was managed at her SNF with Lantus 10U qhs and SSI. This regimen was adjusted as she received steroids and antibiotics with dextrose. At discharge, she was requiring Lantus 3U qhs, and minimal SSI. Please monitor for further adjustment of insulin dosing and requirement.   Hyperkalemia: Patient was transferred to [facility] ED with hyperkalemia of 6.4 at her SNF. EKG at the time showed RBBB, which appeared to be her normal. She did receive one dose of calcium gluconate in the ED for cardiac membrane stabilization. Hyperkalemia resolved with IVF within 2 days.   HLD: Patient was transferred on cholestyramine 4mg  QID. This was discontinued due to concurrent C diff colitis as it increases the risk of toxic colon. Re-introduction can be addressed in the future when she has completed treatment and if seen as still appropriate.   Anxiety: Patient was maintained on her home dosing of buspar 10mg  TID, ativan 0.5 q8hrs prn, and remeron 15mg  qhs.  Ventilator dependent: Patient is vent dependent via trach. Settings PRVC PO2 40 TV 400 PEEP 5 RR 16. She has had PSV trials throughout admission.   Suspected adrenal  insufficiency: Patient was on Decadron 4mg  daily at admission per records. ACTH stim test was obtained and was negative. We began tapering the Decadron one week into admission. We began physiologic hydrocortisone dosing at 15mg  AM/5mg  lunch as we suspect that she is at least mildly adrenally insufficient in setting of serious chronic illness and being bedbound for months.  Discharge Vitals:   BP (!) 83/68   Pulse (!) 57   Temp 98.3 F (36.8 C) (Oral)   Resp 20   Ht 5\' 1"  (1.549 m) Comment: per patient  Wt 65 kg (143 lb 4.8 oz)   SpO2 90%   BMI 27.08 kg/m   Pertinent Labs, Studies, and Procedures:  CBC Latest Ref Rng & Units 04/11/2016 04/10/2016 04/09/2016  WBC 4.0 - 10.5 K/uL 8.1 9.6 15.2(H)  Hemoglobin 12.0 - 15.0 g/dL 8.1(L) 8.9(L) 10.0(L)  Hematocrit 36.0 - 46.0 % 25.0(L) 27.4(L) 31.0(L)  Platelets 150 - 400 K/uL 101(L) 127(L) 151   CMP Latest Ref Rng & Units 04/11/2016 04/10/2016 04/09/2016  Glucose 65 - 99 mg/dL 161(W) 960(A) 89  BUN 6 - 20 mg/dL 54(U) 98(J) 19(J)  Creatinine 0.44 - 1.00 mg/dL 4.78 2.95(A) 2.13  Sodium 135 - 145 mmol/L 139 138 138  Potassium 3.5 - 5.1 mmol/L 3.8 4.0 4.2  Chloride 101 - 111 mmol/L 113(H) 111 112(H)  CO2 22 - 32 mmol/L 22 23 21(L)  Calcium 8.9 - 10.3 mg/dL 7.0(L) 7.2(L) 7.2(L)  Total Protein 6.5 - 8.1 g/dL - 3.9(L) -  Total Bilirubin 0.3 - 1.2 mg/dL - 0.8(M) -  Alkaline Phos 38 - 126 U/L - 88 -  AST 15 - 41 U/L - 25 -  ALT 14 - 54 U/L - 21 -   CXR 03/31/2016: Right PICC noted ending about mid SVC. Mild bilateral opacities may reflect atelectasis or possibly mild pneumonia. Peribronchial thickening seen.   CT abdomen w contrast 04/04/2016: 1. Diffuse soft tissue air tracking along the left anterior abdominal wall, extending from the left upper quadrant of the left lower quadrant, with a small amount of associated fluid noted in the soft tissues. Findings raise concern for infection with a gas producing organism and necrotizing  fasciitis. 2. G-tube noted in expected position. Contrast noted  in the stomach. Stomach unremarkable in appearance. 3. Dense consolidation of the left lower lobe, concerning for pneumonia. Rounded opacity at the right middle lobe may reflect a small infectious cavitation, raising concern for atypical infection. Mild right basilar atelectasis noted. 4. Contrast noted within the distal esophagus, reflecting gastroesophageal reflux. 5. Small bilateral renal cysts seen. 6. Scattered aortic atherosclerosis. 7. **An incidental finding of potential clinical significance has been found. 1.1 cm cystic lesion at the tail of the pancreas. Recommend follow up pre and post contrast MRI/MRCP or pancreatic protocol CT in 2 years. This recommendation follows ACR consensus guidelines: Management of Incidental Pancreatic Cysts: A White Paper of the ACR Incidental Findings Committee. J Am Coll Radiol 2017;14:911-923.**  IR Colonic tube with Fluoro 04/09/2016: Unremarkable gastrostomy tube injection demonstrating no evidence of abnormal contrast leakage, extravasation or fistula.  Discharge Instructions: Discharge Instructions    Discharge instructions    Complete by:  As directed    C diff colitis - Vancomycin 125mg  qid per peg - end date 04/21/2016 Carbapenem resistant K pneumoniae bacteremia - avycaz 1.25g q8hrs - end date 04/21/2016 CRE K pneumoniae and amp S VRE abdominal abscess - avycaz 1.25g q8hrs and tigecycline 50mg  q 12hrs - end date 04/21/2016 DM -Lantus 3U qhs, SSI Adrenal insufficiency: -was tapered off decadron, started on physiologic hydrocortisone 15mg  at 0700 and 5mg  at 1400.  Abdominal abscess: -BID wet to dry dressing with packing      Signed: Nyra MarketGorica Binyomin Brann, MD 04/11/2016, 1:08 PM   Pager [phone]

## 2016-04-06 NOTE — Progress Notes (Signed)
Subjective:  Patient is s/p bedside I&D of infection site on abdomen adjacent to G tube, per surgery. Patient had continued bloody drainage and soft BP's following; Hgb down to 7.4. She got 500ml NS bolus and 1U prbc.  Blood culture from admission + for carbapenem resistant K. Pneumoniae.  Patient endorses stable abdominal discomfort, stable shortness of breath; denies chest pain; no other concerns or complaints at this time.   Objective:  Vital signs in last 24 hours: Vitals:   04/06/16 0700 04/06/16 0745 04/06/16 0800 04/06/16 0807  BP: 123/62 91/68    Pulse: 95 96 (!) 106   Resp: (!) 22 (!) 31 (!) 22   Temp:    98.8 F (37.1 C)  TempSrc:    Oral  SpO2: 100% 100% 92%   Weight:      Height:       Constitutional: NAD, VS reviewed CV: RRR, no murmurs, rubs or gallops appreciated, no LE edema, pulses intact Resp: trach in place with sputum and secretions present; mechanical breath sounds present bilaterally, with rhonchi, no wheezing Vent settings: PRVC, FiO2 40%, RR 16, TV 400, peep 5 Abd: G tube in place, soft, +BS, dry bandage covering I&D site and site of ventral hernia repair.  GU: Foley cath in place Neuro: Alert; neuro grossly intact; asking appropriate questions  Assessment/Plan:  Principal Problem:   Hyperkalemia Active Problems:   Anemia   Pressure injury of skin   Ventilator associated pneumonia (HCC)   Hyperglycemia   Hyponatremia   Ventilator dependence (HCC)   UTI due to Klebsiella species   Recurrent colitis due to Clostridium difficile   Abdominal wall cellulitis  Carbapenem resistant K. pneumoniae bacteriemia: Likely source is UTI as [facility] reported prelim urine cultures positive for the bug but had no sensitivities. She has been on zosyn which would not provide coverage. Per pharm, suggested  --ceftazidime/avibactam per pharmacy --will consult ID - appreciate their assistance --remove PICC; place peripheral line  Abdominal wall  cellulitis: Patient with cellulitis adjacent to her G tube; Bedside U/S did not reveal an abscess, and was consistent with inflammation. CT scan of abdomen shows air within soft tissue of abdominal wall that could be air trapping from g-tube irritation of skin vs necrotizing fasciitis. Surgery performed bedside I&D which was productive of 20ml bloody purulent fluid. Wound was irrigated and packed - she had some bleeding from wound but is now controlled. She required 1U pRBC. --ceftazidime/avibactam --continue monitoring --f/u wound culture  Complicated UTI: Patient with chronic indwelling catheter. Per chart review, patient has history of Klebsiella UTI and per nursing at [facility] had prelim urine culture at transfer showed K. pneumoniae and was about to be started on ertapenem for this infection. UA on admission showed RBC's WBC, and few bacteria which could be consistent with UTI - she was started on cefepime on admission. Two urine cultures had too many species to specify source. This is likely source of bacteremia.  --ceftazidime/avibactam per pharmacy  Acute on Chronic anemia: FOBT was negative, UA was positive for RBC's likely due to trauma as foley was just changed prior to study. Acute blood loss after I&D with Hgb 7.4 - she is s/p 1U pRBC; AM Hgb 9.0. No sign of continued bleeding this morning. --continue to monitor for signs and symptoms of bleeding --f/u AM CBC  Ventilator Dependent - VAP: CT 11/21 showing LLL consolidation consistent with VAP; sputum gram stain showing G- rods, G+ rods, G+ cocci in pairs. Sputum culture shows multiple  organisms present with none predominant. Has had coverage x 6 days     --ceftazidime/avibactam --duonebs q6hrs --we have consulted pulmonary for assistance in assessing vent need and possible weaning - appreciate their help  G-tube dependent: Patient with dark drainage from her G tube site yesterday; cuff does not seem to be holding the G tube in place  very well. Surgery was consulted for evaluation - CT scan showed contrast follow through bowel ruling out obstruction. Tube feeds restarted today.    C difficile colonization:  C dif testing showed +antigen, - toxin, and +PCR; patient likely colonized.  --will monitor for recurrence of increased diarrhea and vital sign changes for re-initiating treatement   T2DM: Tube feeds restarted; d/c D5 1/2NS IVF -- Lantus 12U qhs -- SSI - s  HLD: -- Continue home Cholestyramine 4 mg QID per tube  GERD: -- Continue home pepcid 20 mg per tube daily   Anxiety: -- Continue home Buspar -- Continue home ativan 0.5 q8hrs prn per tube -- Continue home remeron 15 mg QHS  FEN: NPO for now - restart after bedside I&D - tube feeds per G tube per nutrition, free water 100ml per G tube,  IVF NS x 10hrs CODE: DNR DVT PPx: lovenox  Dispo: Anticipated discharge in approximately 2-3 day(s). [facility]. With new evidence for resistant Klebsiella bacteremia and acute bleed, she will need further treatment and monitoring before she is stable for d/c.   Nyra MarketGorica Nohelia Valenza, MD 04/06/2016, 9:26 AM Pager [phone]

## 2016-04-07 DIAGNOSIS — A491 Streptococcal infection, unspecified site: Secondary | ICD-10-CM

## 2016-04-07 DIAGNOSIS — Z1621 Resistance to vancomycin: Secondary | ICD-10-CM

## 2016-04-07 DIAGNOSIS — Z452 Encounter for adjustment and management of vascular access device: Secondary | ICD-10-CM

## 2016-04-07 DIAGNOSIS — A498 Other bacterial infections of unspecified site: Secondary | ICD-10-CM

## 2016-04-07 DIAGNOSIS — R093 Abnormal sputum: Secondary | ICD-10-CM

## 2016-04-07 DIAGNOSIS — J189 Pneumonia, unspecified organism: Secondary | ICD-10-CM

## 2016-04-07 DIAGNOSIS — J150 Pneumonia due to Klebsiella pneumoniae: Secondary | ICD-10-CM

## 2016-04-07 LAB — GLUCOSE, CAPILLARY
GLUCOSE-CAPILLARY: 127 mg/dL — AB (ref 65–99)
GLUCOSE-CAPILLARY: 175 mg/dL — AB (ref 65–99)
Glucose-Capillary: 191 mg/dL — ABNORMAL HIGH (ref 65–99)
Glucose-Capillary: 201 mg/dL — ABNORMAL HIGH (ref 65–99)
Glucose-Capillary: 167 mg/dL — ABNORMAL HIGH (ref 65–99)

## 2016-04-07 LAB — BASIC METABOLIC PANEL
Anion gap: 5 (ref 5–15)
BUN: 32 mg/dL — AB (ref 6–20)
CALCIUM: 7.7 mg/dL — AB (ref 8.9–10.3)
CO2: 18 mmol/L — AB (ref 22–32)
Chloride: 111 mmol/L (ref 101–111)
Creatinine, Ser: 0.6 mg/dL (ref 0.44–1.00)
GFR calc Af Amer: >60 mL/min (ref >60)
GLUCOSE: 128 mg/dL — AB (ref 65–99)
Potassium: 4.6 mmol/L (ref 3.5–5.1)
Sodium: 134 mmol/L — ABNORMAL LOW (ref 135–145)

## 2016-04-07 LAB — CBC
HEMATOCRIT: 26.8 % — AB (ref 36.0–46.0)
Hemoglobin: 8.6 g/dL — ABNORMAL LOW (ref 12.0–15.0)
MCH: 30.7 pg (ref 26.0–34.0)
MCHC: 32.1 g/dL (ref 30.0–36.0)
MCV: 95.7 fL (ref 78.0–100.0)
Platelets: 128 10*3/uL — ABNORMAL LOW (ref 150–400)
RBC: 2.8 MIL/uL — ABNORMAL LOW (ref 3.87–5.11)
RDW: 16.7 % — AB (ref 11.5–15.5)
WBC: 14.1 10*3/uL — ABNORMAL HIGH (ref 4.0–10.5)

## 2016-04-07 MED ORDER — INSULIN GLARGINE 100 UNIT/ML ~~LOC~~ SOLN
9.0000 [IU] | Freq: Every day | SUBCUTANEOUS | Status: DC
  Administered 2016-04-07 – 2016-04-08 (×2): 9 [IU] via SUBCUTANEOUS
  Filled 2016-04-07 (×5): qty 0.09

## 2016-04-07 MED ORDER — SODIUM CHLORIDE 0.9 % IV SOLN
2.0000 g | Freq: Four times a day (QID) | INTRAVENOUS | Status: DC
  Administered 2016-04-07 – 2016-04-08 (×4): 2 g via INTRAVENOUS
  Filled 2016-04-07 (×6): qty 2000

## 2016-04-07 MED ORDER — INSULIN ASPART 100 UNIT/ML ~~LOC~~ SOLN
0.0000 [IU] | SUBCUTANEOUS | Status: DC
  Administered 2016-04-07 – 2016-04-08 (×3): 2 [IU] via SUBCUTANEOUS
  Administered 2016-04-08 (×2): 1 [IU] via SUBCUTANEOUS
  Administered 2016-04-09 – 2016-04-10 (×3): 2 [IU] via SUBCUTANEOUS
  Administered 2016-04-10 – 2016-04-11 (×3): 1 [IU] via SUBCUTANEOUS

## 2016-04-07 NOTE — Progress Notes (Signed)
Hypoglycemic Event  CBG: 49  Treatment: D50 IV 25 mL  Symptoms: Shaky and Nervous/irritable  Follow-up CBG: Time:2249 (d/t difficult stick, multiple attempts) CBG Result: 92  Possible Reasons for Event: Unknown  Comments/MD notified: Will pass on to oncoming nurse in AM.    Meriel Flavorsachel R Shawndrea Rutkowski

## 2016-04-07 NOTE — Progress Notes (Signed)
Subjective:   He feels less well she gestures believe due to being back on the ventilator Antibiotics:  Anti-infectives    Start     Dose/Rate Route Frequency Ordered Stop   04/07/16 1300  ampicillin (OMNIPEN) 2 g in sodium chloride 0.9 % 50 mL IVPB     2 g 150 mL/hr over 20 Minutes Intravenous Every 6 hours 04/07/16 1201     04/06/16 2100  vancomycin (VANCOCIN) 50 mg/mL oral solution 125 mg     125 mg Per Tube Every 6 hours 04/06/16 2031     04/06/16 2100  vancomycin (VANCOCIN) IVPB 1000 mg/200 mL premix  Status:  Discontinued     1,000 mg 200 mL/hr over 60 Minutes Intravenous Every 24 hours 04/06/16 2035 04/07/16 1118   04/06/16 0930  ceftazidime-avibactam (AVYCAZ) 1.25 g in dextrose 5 % 50 mL IVPB     1.25 g 25 mL/hr over 2 Hours Intravenous Every 8 hours 04/06/16 0920     04/05/16 1100  vancomycin (VANCOCIN) IVPB 1000 mg/200 mL premix  Status:  Discontinued     1,000 mg 200 mL/hr over 60 Minutes Intravenous Every 24 hours 04/05/16 1015 04/06/16 0920   04/05/16 1000  ceFEPIme (MAXIPIME) 1 g in dextrose 5 % 50 mL IVPB  Status:  Discontinued     1 g 100 mL/hr over 30 Minutes Intravenous Every 24 hours 04/04/16 1123 04/04/16 1152   04/05/16 0500  piperacillin-tazobactam (ZOSYN) IVPB 3.375 g  Status:  Discontinued     3.375 g 12.5 mL/hr over 240 Minutes Intravenous Every 8 hours 04/04/16 2200 04/06/16 0920   04/04/16 2230  piperacillin-tazobactam (ZOSYN) IVPB 3.375 g     3.375 g 100 mL/hr over 30 Minutes Intravenous  Once 04/04/16 2200 04/04/16 2300   04/04/16 2200  clindamycin (CLEOCIN) IVPB 600 mg  Status:  Discontinued     600 mg 100 mL/hr over 30 Minutes Intravenous Every 8 hours 04/04/16 2153 04/04/16 2211   04/04/16 1400  ceFAZolin (ANCEF) IVPB 1 g/50 mL premix  Status:  Discontinued     1 g 100 mL/hr over 30 Minutes Intravenous Every 8 hours 04/04/16 1152 04/04/16 2152   04/04/16 1400  vancomycin (VANCOCIN) IVPB 750 mg/150 ml premix  Status:  Discontinued     750 mg 150 mL/hr over 60 Minutes Intravenous Every 24 hours 04/04/16 1252 04/05/16 1015   04/01/16 1200  vancomycin (VANCOCIN) 50 mg/mL oral solution 125 mg  Status:  Discontinued     125 mg Oral Every 6 hours 04/01/16 1040 04/04/16 1147   04/01/16 1200  ceFEPIme (MAXIPIME) 1 g in dextrose 5 % 50 mL IVPB  Status:  Discontinued     1 g 100 mL/hr over 30 Minutes Intravenous Every 12 hours 04/01/16 1130 04/04/16 1123   04/01/16 1000  fluconazole (DIFLUCAN) tablet 200 mg  Status:  Discontinued     200 mg Per Tube Daily 04/01/16 0322 04/02/16 1246   04/01/16 0800  piperacillin-tazobactam (ZOSYN) IVPB 3.375 g  Status:  Discontinued     3.375 g 12.5 mL/hr over 240 Minutes Intravenous Every 8 hours 04/01/16 0200 04/01/16 0729   03/31/16 2330  piperacillin-tazobactam (ZOSYN) IVPB 3.375 g     3.375 g 100 mL/hr over 30 Minutes Intravenous  Once 03/31/16 2320 04/01/16 0220      Medications: Scheduled Meds: . ampicillin (OMNIPEN) IV  2 g Intravenous Q6H  . busPIRone  10 mg Per Tube TID  . ceftazidime avibactam (AVYCAZ)  IVPB  1.25 g Intravenous Q8H  . chlorhexidine gluconate (MEDLINE KIT)  15 mL Mouth Rinse BID  . cholestyramine  8 g Per Tube 2 times per day  . dexamethasone  2 mg Per Tube Daily  . enoxaparin (LOVENOX) injection  40 mg Subcutaneous Q24H  . famotidine  20 mg Per Tube Daily  . feeding supplement (PRO-STAT SUGAR FREE 64)  30 mL Per Tube BID  . fentaNYL (SUBLIMAZE) injection  25 mcg Intravenous Once  . free water  100 mL Per Tube Q8H  . Gerhardt's butt cream   Topical QID  . insulin aspart  0-9 Units Subcutaneous TID WC  . insulin glargine  9 Units Subcutaneous QHS  . ipratropium-albuterol  3 mL Nebulization TID  . lidocaine-EPINEPHrine  20 mL Intradermal Once  . mouth rinse  15 mL Mouth Rinse QID  . mirtazapine  15 mg Per Tube QHS  . multivitamin  15 mL Per Tube Daily  . sodium chloride flush  10-40 mL Intracatheter Q12H  . sodium chloride flush  3 mL Intravenous Q12H  .  traMADol  25 mg Per Tube BID  . vancomycin  125 mg Per Tube Q6H   Continuous Infusions: . feeding supplement (GLUCERNA 1.2 CAL) 1,000 mL (04/07/16 1100)   PRN Meds:.fentaNYL (SUBLIMAZE) injection, HYDROcodone-acetaminophen, loperamide, LORazepam, sodium chloride flush    Objective: Weight change:   Intake/Output Summary (Last 24 hours) at 04/07/16 1204 Last data filed at 04/07/16 1100  Gross per 24 hour  Intake             3195 ml  Output              835 ml  Net             2360 ml   Blood pressure (!) 103/55, pulse (!) 107, temperature 97.9 F (36.6 C), temperature source Oral, resp. rate (!) 37, height _0  (1.549 m), weight 129 lb 6.6 oz (58.7 kg), SpO2 100 %. Temp:  [97.3 F (36.3 C)-98.4 F (36.9 C)] 97.9 F (36.6 C) (11/25 1100) Pulse Rate:  [72-107] 107 (11/25 1100) Resp:  [12-37] 37 (11/25 1100) BP: (78-115)/(46-76) 103/55 (11/25 1100) SpO2:  [98 %-100 %] 100 % (11/25 1100) FiO2 (%):  [40 %] 40 % (11/25 0808) Weight:  [129 lb 6.6 oz (58.7 kg)] 129 lb 6.6 oz (58.7 kg) (11/25 0600)  Physical Exam: General: Alert and awake, oriented x3, On the ventilator HEENT: anicteric sclera,  EOMI CVS regular rate, normal r,  no murmur rubs or gallops Chest: Fairly clear to auscultation bilaterally, no wheezing, rales or rhonchi Abdomen: Wounds  not examined by me today  Skin: no rashes  Neuro: nonfocal  CBC:  CBC Latest Ref Rng & Units 04/07/2016 04/06/2016 04/05/2016  WBC 4.0 - 10.5 K/uL 14.1(H) 14.3(H) 15.9(H)  Hemoglobin 12.0 - 15.0 g/dL 8.6(L) 9.0(L) 7.4(L)  Hematocrit 36.0 - 46.0 % 26.8(L) 27.5(L) 23.4(L)  Platelets 150 - 400 K/uL 128(L) 133(L) 178      BMET  Recent Labs  04/06/16 0400 04/07/16 0719  NA 138 134*  K 3.8 4.6  CL 111 111  CO2 21* 18*  GLUCOSE 146* 128*  BUN 28* 32*  CREATININE 0.60 0.60  CALCIUM 7.3* 7.7*     Liver Panel  No results for input(s): PROT, ALBUMIN, AST, ALT, ALKPHOS, BILITOT, BILIDIR, IBILI in the last 72  hours.     Sedimentation Rate No results for input(s): ESRSEDRATE in the last 72 hours. C-Reactive Protein No  results for input(s): CRP in the last 72 hours.  Micro Results: Recent Results (from the past 720 hour(s))  Culture, respiratory (NON-Expectorated)     Status: None   Collection Time: 04/01/16  6:46 AM  Result Value Ref Range Status   Specimen Description SPUTUM  Final   Special Requests NONE  Final   Gram Stain   Final    ABUNDANT WBC PRESENT, PREDOMINANTLY PMN ABUNDANT GRAM NEGATIVE RODS FEW GRAM POSITIVE RODS RARE GRAM POSITIVE COCCI IN PAIRS    Culture MULTIPLE ORGANISMS PRESENT, NONE PREDOMINANT  Final   Report Status 04/03/2016 FINAL  Final  MRSA PCR Screening     Status: None   Collection Time: 04/01/16  7:42 AM  Result Value Ref Range Status   MRSA by PCR NEGATIVE NEGATIVE Final    Comment:        The GeneXpert MRSA Assay (FDA approved for NASAL specimens only), is one component of a comprehensive MRSA colonization surveillance program. It is not intended to diagnose MRSA infection nor to guide or monitor treatment for MRSA infections.   Culture, blood (routine x 2)     Status: Abnormal (Preliminary result)   Collection Time: 04/01/16 10:46 AM  Result Value Ref Range Status   Specimen Description BLOOD PICC LINE  Final   Special Requests BOTTLES DRAWN AEROBIC AND ANAEROBIC 5CC  Final   Culture  Setup Time   Final    GRAM NEGATIVE RODS AEROBIC BOTTLE ONLY CRITICAL RESULT CALLED TO, READ BACK BY AND VERIFIED WITH: T STONE,PHARMD AT 0909 04/06/16 BY L BENFIELD    Culture (A)  Final    KLEBSIELLA PNEUMONIAE SUSCEPTIBILITIES TO FOLLOW    Report Status PENDING  Incomplete  Blood Culture ID Panel (Reflexed)     Status: Abnormal   Collection Time: 04/01/16 10:46 AM  Result Value Ref Range Status   Enterococcus species NOT DETECTED NOT DETECTED Final   Listeria monocytogenes NOT DETECTED NOT DETECTED Final   Staphylococcus species NOT DETECTED NOT  DETECTED Final   Staphylococcus aureus NOT DETECTED NOT DETECTED Final   Streptococcus species NOT DETECTED NOT DETECTED Final   Streptococcus agalactiae NOT DETECTED NOT DETECTED Final   Streptococcus pneumoniae NOT DETECTED NOT DETECTED Final   Streptococcus pyogenes NOT DETECTED NOT DETECTED Final   Acinetobacter baumannii NOT DETECTED NOT DETECTED Final   Enterobacteriaceae species DETECTED (A) NOT DETECTED Final    Comment: CRITICAL RESULT CALLED TO, READ BACK BY AND VERIFIED WITH: T STONE,PHARMD AT 0909 04/06/16 BY L BENFIELD    Enterobacter cloacae complex NOT DETECTED NOT DETECTED Final   Escherichia coli NOT DETECTED NOT DETECTED Final   Klebsiella oxytoca NOT DETECTED NOT DETECTED Final   Klebsiella pneumoniae DETECTED (A) NOT DETECTED Final    Comment: CRITICAL RESULT CALLED TO, READ BACK BY AND VERIFIED WITH: T STONE,PHARMD AT 0909 04/06/16 BY L BENFIELD    Proteus species NOT DETECTED NOT DETECTED Final   Serratia marcescens NOT DETECTED NOT DETECTED Final   Carbapenem resistance DETECTED (A) NOT DETECTED Final    Comment: CRITICAL RESULT CALLED TO, READ BACK BY AND VERIFIED WITH: T STONE,PHARMD AT 0909 04/06/16 BY L BENFIELD    Haemophilus influenzae NOT DETECTED NOT DETECTED Final   Neisseria meningitidis NOT DETECTED NOT DETECTED Final   Pseudomonas aeruginosa NOT DETECTED NOT DETECTED Final   Candida albicans NOT DETECTED NOT DETECTED Final   Candida glabrata NOT DETECTED NOT DETECTED Final   Candida krusei NOT DETECTED NOT DETECTED Final   Candida parapsilosis  NOT DETECTED NOT DETECTED Final   Candida tropicalis NOT DETECTED NOT DETECTED Final  Culture, blood (routine x 2)     Status: None   Collection Time: 04/01/16 10:58 AM  Result Value Ref Range Status   Specimen Description BLOOD LEFT HAND  Final   Special Requests IN PEDIATRIC BOTTLE 3CC  Final   Culture NO GROWTH 5 DAYS  Final   Report Status 04/06/2016 FINAL  Final  Culture, Urine     Status: Abnormal    Collection Time: 04/01/16 12:00 PM  Result Value Ref Range Status   Specimen Description URINE, CATHETERIZED  Final   Special Requests NONE  Final   Culture MULTIPLE SPECIES PRESENT, SUGGEST RECOLLECTION (A)  Final   Report Status 04/02/2016 FINAL  Final  C difficile quick scan w PCR reflex     Status: Abnormal   Collection Time: 04/02/16  8:25 AM  Result Value Ref Range Status   C Diff antigen POSITIVE (A) NEGATIVE Final   C Diff toxin NEGATIVE NEGATIVE Final   C Diff interpretation Results are indeterminate. See PCR results.  Final  Clostridium Difficile by PCR     Status: Abnormal   Collection Time: 04/02/16  8:25 AM  Result Value Ref Range Status   Toxigenic C Difficile by pcr POSITIVE (A) NEGATIVE Final    Comment: Positive for toxigenic C. difficile with little to no toxin production. Only treat if clinical presentation suggests symptomatic illness.  Culture, Urine     Status: Abnormal   Collection Time: 04/02/16  5:17 PM  Result Value Ref Range Status   Specimen Description URINE, CATHETERIZED  Final   Special Requests Cefepime  Final   Culture MULTIPLE SPECIES PRESENT, SUGGEST RECOLLECTION (A)  Final   Report Status 04/04/2016 FINAL  Final  Aerobic Culture (superficial specimen)     Status: None (Preliminary result)   Collection Time: 04/04/16  4:08 PM  Result Value Ref Range Status   Specimen Description FLUID  Final   Special Requests G TUBE DISCHARGE SWAB  Final   Gram Stain   Final    ABUNDANT WBC PRESENT,BOTH PMN AND MONONUCLEAR ABUNDANT GRAM POSITIVE RODS MODERATE GRAM NEGATIVE RODS    Culture   Final    MODERATE KLEBSIELLA PNEUMONIAE REPEATING SENSITIVITIES TO CONFIRM RESISTANT PATTERN MODERATE VANCOMYCIN RESISTANT ENTEROCOCCUS    Report Status PENDING  Incomplete   Organism ID, Bacteria VANCOMYCIN RESISTANT ENTEROCOCCUS  Final      Susceptibility   Vancomycin resistant enterococcus - MIC*    AMPICILLIN <=2 SENSITIVE Sensitive     VANCOMYCIN >=32  RESISTANT Resistant     GENTAMICIN SYNERGY RESISTANT Resistant     LINEZOLID 1 SENSITIVE Sensitive     * MODERATE VANCOMYCIN RESISTANT ENTEROCOCCUS  Aerobic Culture (superficial specimen)     Status: None (Preliminary result)   Collection Time: 04/04/16  4:10 PM  Result Value Ref Range Status   Specimen Description ABDOMEN  Final   Special Requests   Final     CHECK FOR MRSA SKIN SWAB ON THE SITE OF THE ABDOMEN CELLULITIS   Gram Stain   Final    RARE WBC PRESENT, PREDOMINANTLY MONONUCLEAR RARE GRAM NEGATIVE RODS RARE GRAM POSITIVE RODS    Culture   Final    RARE KLEBSIELLA PNEUMONIAE FEW ENTEROCOCCUS FAECALIS    Report Status PENDING  Incomplete  Aerobic Culture (superficial specimen)     Status: None (Preliminary result)   Collection Time: 04/05/16  1:50 PM  Result Value Ref Range Status  Specimen Description ABSCESS LEFT ABDOMEN  Final   Special Requests LEFT ABDOMINAL WALL ABSCESS  Final   Gram Stain   Final    FEW WBC PRESENT, PREDOMINANTLY PMN NO ORGANISMS SEEN    Culture   Final    RARE KLEBSIELLA PNEUMONIAE REPEATING SENSITIVITIES TO CONFIRM RESISTANT PATTERN    Report Status PENDING  Incomplete    Studies/Results: No results found.    Assessment/Plan:  INTERVAL HISTORY: He is growing Klebsiella pneumonia from multiple sites likely the K PC organism also with VRE that is sensitive to ampicillin   Principal Problem:   Hyperkalemia Active Problems:   Anemia   Pressure injury of skin   Ventilator associated pneumonia (HCC)   Hyperglycemia   Hyponatremia   Ventilator dependence ([place])   UTI due to Klebsiella species   Recurrent colitis due to Clostridium difficile   Abdominal wall cellulitis   PICC (peripherally inserted central catheter) in place   Infection due to carbapenemase producing organism   Pneumonia of right lower lobe due to Klebsiella pneumoniae ([place])   Janice Watson is a 82 y.o. female with  VDRF, COPD, abdominal abscess evidence  for ventral or associated pneumonia and possibly Clostridium difficile, now found to have K PC klebsiella pneumonia bacteremia   #1 K PC klebsiella bacteremia:  Continue AVYCAZ PICC line is out  Repeat blood cultures to ensure clearance  Pharmacy is asking microbiology to have this ordered some tested against AVYCAZ, COLISTIN TYGACIL  #2 abdominal abscess: Status post I&D by surgery this is growing moderate Klebsiella pneumoniae I suspect the K PC organism along with moderate the Re: That is sensitive to ampicillin Continue  the AVYCAZ  Add ampicillin for coverage of the enterococcus. Zyvox would not be a good choice here given her thrombocytopenia all well be a risk or seizures that will be greater using double beta-lactam therapy but we don't have many options here.  #3 ventilator associated pneumonia: Is very broadly covered with the above organisms that he'll aspirate culture didn't yield anything of significance.  #4 cdi Antigen positive TOXIn negative: Upper she was in the midst of treatment when she was tested I would continue treated with oral vancomycin and complete 2 weeks of therapy.    LOS: 7 days   Alcide Evener 04/07/2016, 12:04 PM

## 2016-04-07 NOTE — Progress Notes (Addendum)
Subjective: Awake on vent  Objective: Vital signs in last 24 hours: Temp:  [97.3 F (36.3 C)-98.4 F (36.9 C)] 98.1 F (36.7 C) (11/25 0808) Pulse Rate:  [72-109] 74 (11/25 0808) Resp:  [12-34] 12 (11/25 0808) BP: (78-115)/(46-76) 98/52 (11/25 0808) SpO2:  [98 %-100 %] 100 % (11/25 0808) FiO2 (%):  [40 %] 40 % (11/25 0808) Weight:  [58.7 kg (129 lb 6.6 oz)] 58.7 kg (129 lb 6.6 oz) (11/25 0600) Last BM Date: 04/06/16  Intake/Output from previous day: 11/24 0701 - 11/25 0700 In: 3228.3 [P.O.:420; I.V.:1158.3; NG/GT:1040; IV Piggyback:400] Out: 1210 [Urine:940; Drains:100; Stool:170] Intake/Output this shift: No intake/output data recorded.  General appearance: cooperative Resp: rhonchi L>R Cardio: regular rate and rhythm GI: soft, I&D site packing changed, less drainage, cellulitis improving  Lab Results:   Recent Labs  04/06/16 0400 04/07/16 0719  WBC 14.3* 14.1*  HGB 9.0* 8.6*  HCT 27.5* 26.8*  PLT 133* 128*   BMET  Recent Labs  04/05/16 0523 04/05/16 2048 04/06/16 0400  NA 132* 137 138  K 3.8 3.8 3.8  CL 104 107 111  CO2 24  --  21*  GLUCOSE 67 224* 146*  BUN 22* 23* 28*  CREATININE 0.53 0.60 0.60  CALCIUM 7.6*  --  7.3*   PT/INR No results for input(s): LABPROT, INR in the last 72 hours. ABG No results for input(s): PHART, HCO3 in the last 72 hours.  Invalid input(s): PCO2, PO2  Studies/Results: No results found.  Anti-infectives: Anti-infectives    Start     Dose/Rate Route Frequency Ordered Stop   04/06/16 2100  vancomycin (VANCOCIN) 50 mg/mL oral solution 125 mg     125 mg Per Tube Every 6 hours 04/06/16 2031     04/06/16 2100  vancomycin (VANCOCIN) IVPB 1000 mg/200 mL premix     1,000 mg 200 mL/hr over 60 Minutes Intravenous Every 24 hours 04/06/16 2035     04/06/16 0930  ceftazidime-avibactam (AVYCAZ) 1.25 g in dextrose 5 % 50 mL IVPB     1.25 g 25 mL/hr over 2 Hours Intravenous Every 8 hours 04/06/16 0920     04/05/16 1100   vancomycin (VANCOCIN) IVPB 1000 mg/200 mL premix  Status:  Discontinued     1,000 mg 200 mL/hr over 60 Minutes Intravenous Every 24 hours 04/05/16 1015 04/06/16 0920   04/05/16 1000  ceFEPIme (MAXIPIME) 1 g in dextrose 5 % 50 mL IVPB  Status:  Discontinued     1 g 100 mL/hr over 30 Minutes Intravenous Every 24 hours 04/04/16 1123 04/04/16 1152   04/05/16 0500  piperacillin-tazobactam (ZOSYN) IVPB 3.375 g  Status:  Discontinued     3.375 g 12.5 mL/hr over 240 Minutes Intravenous Every 8 hours 04/04/16 2200 04/06/16 0920   04/04/16 2230  piperacillin-tazobactam (ZOSYN) IVPB 3.375 g     3.375 g 100 mL/hr over 30 Minutes Intravenous  Once 04/04/16 2200 04/04/16 2300   04/04/16 2200  clindamycin (CLEOCIN) IVPB 600 mg  Status:  Discontinued     600 mg 100 mL/hr over 30 Minutes Intravenous Every 8 hours 04/04/16 2153 04/04/16 2211   04/04/16 1400  ceFAZolin (ANCEF) IVPB 1 g/50 mL premix  Status:  Discontinued     1 g 100 mL/hr over 30 Minutes Intravenous Every 8 hours 04/04/16 1152 04/04/16 2152   04/04/16 1400  vancomycin (VANCOCIN) IVPB 750 mg/150 ml premix  Status:  Discontinued     750 mg 150 mL/hr over 60 Minutes Intravenous Every 24 hours  04/04/16 1252 04/05/16 1015   04/01/16 1200  vancomycin (VANCOCIN) 50 mg/mL oral solution 125 mg  Status:  Discontinued     125 mg Oral Every 6 hours 04/01/16 1040 04/04/16 1147   04/01/16 1200  ceFEPIme (MAXIPIME) 1 g in dextrose 5 % 50 mL IVPB  Status:  Discontinued     1 g 100 mL/hr over 30 Minutes Intravenous Every 12 hours 04/01/16 1130 04/04/16 1123   04/01/16 1000  fluconazole (DIFLUCAN) tablet 200 mg  Status:  Discontinued     200 mg Per Tube Daily 04/01/16 0322 04/02/16 1246   04/01/16 0800  piperacillin-tazobactam (ZOSYN) IVPB 3.375 g  Status:  Discontinued     3.375 g 12.5 mL/hr over 240 Minutes Intravenous Every 8 hours 04/01/16 0200 04/01/16 0729   03/31/16 2330  piperacillin-tazobactam (ZOSYN) IVPB 3.375 g     3.375 g 100 mL/hr over 30  Minutes Intravenous  Once 03/31/16 2320 04/01/16 0220      Assessment/Plan: S/P I&D abdominal wall abscess at G tube site - Improving. Continue abx per ID and wet to dry dressing changes. CX shows klebsiella but is still pending. PNA/vent - per primary service/ID  LOS: 7 days    Emilygrace Grothe E 04/07/2016

## 2016-04-07 NOTE — Progress Notes (Signed)
  Date: 04/07/2016  Patient name: Janice Watson  Medical record number: [id number]  Date of birth: 1932/04/17   I have personally seen and evaluated this patient and the plan of care was discussed with the house staff. Please see Dr. Bo MerinoSaraiya's note for complete details. I concur with his findings.  Janice Watson if chronically ill and appears at her baseline today.  She has resistant infection for which ID is following and she is on appropriate Abx at this time.   Janice CatalinaEmily B Mullen, MD 04/07/2016, 11:19 AM

## 2016-04-07 NOTE — Progress Notes (Signed)
Subjective:   No acute events overnight. Feeling okay. HgB stable.   Objective:  Vital signs in last 24 hours: Vitals:   04/07/16 0808 04/07/16 0900 04/07/16 1000 04/07/16 1100  BP: (!) 98/52 (!) 98/49 (!) 109/54 (!) 103/55  Pulse: 74 97 (!) 106 (!) 107  Resp: 12 17 (!) 22 (!) 37  Temp: 98.1 F (36.7 C)     TempSrc: Oral     SpO2: 100% 100% 100% 100%  Weight:      Height:       Constitutional: NAD, VS reviewed CV: RRR, no murmurs, rubs or gallops appreciated, no LE edema Resp: trach in place with sputum and secretions present; mechanical breath sounds present bilaterally, with rhonchi, no wheezing Abd: G tube in place, soft, +BS, dry bandage covering I&D site and site of ventral hernia repair.  GU: Foley cath in place, rectal tube in place Neuro: Alert; neuro grossly intact; asking appropriate questions  Assessment/Plan:  Principal Problem:   Hyperkalemia Active Problems:   Anemia   Pressure injury of skin   Ventilator associated pneumonia (HCC)   Hyperglycemia   Hyponatremia   Ventilator dependence (HCC)   UTI due to Klebsiella species   Recurrent colitis due to Clostridium difficile   Abdominal wall cellulitis   PICC (peripherally inserted central catheter) in place   Infection due to carbapenemase producing organism   Pneumonia of right lower lobe due to Klebsiella pneumoniae (HCC)  Carbapenem resistant K. pneumoniae bacteriemia: Likely source is UTI as [facility] reported prelim urine cultures positive for the bug but had no sensitivities. She had been on zosyn which would not provide coverage. ID was consulted and recommended  ceftazidime/avibactam per pharmacy, and removing picc line.  -continue Avycaz per pharm    Abdominal wall cellulitis: Patient with cellulitis adjacent to her G tube; Bedside U/S did not reveal an abscess, and was consistent with inflammation. CT scan of abdomen shows air within soft tissue of abdominal wall that could be air trapping from  g-tube irritation of skin vs necrotizing fasciitis. Surgery performed bedside I&D which was productive of 20ml bloody purulent fluid. Wound was irrigated and packed - she had some bleeding from wound but is now controlled. She required 1U pRBC. HGb has been stable overnight.   --ceftazidime/avibactam --continue monitoring, surgery following --f/u wound culture  Complicated UTI: --ceftazidime/avibactam per pharmacy, which is treating the kleb. bacteremia  Acute on Chronic anemia: FOBT was negative, UA was positive for RBC's likely due to trauma as foley was just changed prior to study. Acute blood loss after I&D with Hgb 7.4 - she is s/p 1U pRBC; AM Hgb 9.0. No sign of continued bleeding this morning. --continue to monitor for signs and symptoms of bleeding --f/u AM CBC  Ventilator Dependent - VAP: CT 11/21 showing LLL consolidation consistent with VAP; sputum gram stain showing G- rods, G+ rods, G+ cocci in pairs. Sputum culture shows multiple organisms present with none predominant. Has had coverage x 6 days      --ceftazidime/avibactam --duonebs q6hrs --we have consulted pulmonary for assistance in assessing vent need and possible weaning - appreciate their help  G-tube dependent:   -continue tube feeds  C difficile colonization:  C dif testing showed +antigen, - toxin, and +PCR; patient likely colonized.   -ID restarted oral vanc.   T2DM: Tube feeds restarted; d/c D5 1/2NS IVF. Overnight had hypoglycemic event to 49. --decreased  Lantus from 12U qhs to 9 units  -- SSI - s  FEN: tube feeds per G tube per nutrition, free water 100ml per G tube,  IVF NS x 10hrs CODE: DNR DVT PPx: lovenox  Dispo: Anticipated discharge in approximately 2-3 day(s). [facility]. With new evidence for resistant Klebsiella bacteremia and acute bleed, she will need further treatment and monitoring before she is stable for d/c.   Deneise LeverParth Kima Malenfant, MD 04/07/2016,  11:10 AM Pager [phone]

## 2016-04-08 ENCOUNTER — Encounter: Payer: Self-pay | Admitting: Infectious Disease

## 2016-04-08 LAB — BASIC METABOLIC PANEL
Anion gap: 5 (ref 5–15)
BUN: 30 mg/dL — AB (ref 6–20)
CHLORIDE: 110 mmol/L (ref 101–111)
CO2: 20 mmol/L — ABNORMAL LOW (ref 22–32)
Calcium: 7.6 mg/dL — ABNORMAL LOW (ref 8.9–10.3)
Creatinine, Ser: 0.5 mg/dL (ref 0.44–1.00)
GFR calc Af Amer: >60 mL/min (ref >60)
GFR calc non Af Amer: >60 mL/min (ref >60)
GLUCOSE: 138 mg/dL — AB (ref 65–99)
POTASSIUM: 4.3 mmol/L (ref 3.5–5.1)
Sodium: 135 mmol/L (ref 135–145)

## 2016-04-08 LAB — CBC
HCT: 21.8 % — ABNORMAL LOW (ref 36.0–46.0)
HEMATOCRIT: 26.9 % — AB (ref 36.0–46.0)
HEMOGLOBIN: 7.1 g/dL — AB (ref 12.0–15.0)
HEMOGLOBIN: 8.7 g/dL — AB (ref 12.0–15.0)
MCH: 31.3 pg (ref 26.0–34.0)
MCH: 30.4 pg (ref 26.0–34.0)
MCHC: 32.6 g/dL (ref 30.0–36.0)
MCHC: 32.3 g/dL (ref 30.0–36.0)
MCV: 96 fL (ref 78.0–100.0)
MCV: 94.1 fL (ref 78.0–100.0)
Platelets: 109 10*3/uL — ABNORMAL LOW (ref 150–400)
Platelets: 100 10*3/uL — ABNORMAL LOW (ref 150–400)
RBC: 2.27 MIL/uL — AB (ref 3.87–5.11)
RBC: 2.86 MIL/uL — AB (ref 3.87–5.11)
RDW: 16.9 % — ABNORMAL HIGH (ref 11.5–15.5)
RDW: 17 % — AB (ref 11.5–15.5)
WBC: 9.1 10*3/uL (ref 4.0–10.5)
WBC: 10 10*3/uL (ref 4.0–10.5)

## 2016-04-08 LAB — PREPARE RBC (CROSSMATCH)

## 2016-04-08 LAB — GLUCOSE, CAPILLARY
GLUCOSE-CAPILLARY: 130 mg/dL — AB (ref 65–99)
GLUCOSE-CAPILLARY: 138 mg/dL — AB (ref 65–99)
Glucose-Capillary: 81 mg/dL (ref 65–99)
Glucose-Capillary: 127 mg/dL — ABNORMAL HIGH (ref 65–99)
Glucose-Capillary: 136 mg/dL — ABNORMAL HIGH (ref 65–99)
Glucose-Capillary: 99 mg/dL (ref 65–99)

## 2016-04-08 LAB — AEROBIC CULTURE W GRAM STAIN (SUPERFICIAL SPECIMEN)

## 2016-04-08 LAB — AEROBIC CULTURE  (SUPERFICIAL SPECIMEN)

## 2016-04-08 LAB — ALDOSTERONE + RENIN ACTIVITY W/ RATIO
ALDO / PRA RATIO: 0.2 (ref 0.0–30.0)
ALDOSTERONE: 1.1 ng/dL (ref 0.0–30.0)
PRA LC/MS/MS: 7.088 ng/mL/h — AB (ref 0.167–5.380)

## 2016-04-08 MED ORDER — SODIUM CHLORIDE 0.9 % IV BOLUS (SEPSIS)
1000.0000 mL | Freq: Once | INTRAVENOUS | Status: AC
  Administered 2016-04-08: 1000 mL via INTRAVENOUS

## 2016-04-08 MED ORDER — SODIUM CHLORIDE 0.9 % IV BOLUS (SEPSIS)
250.0000 mL | Freq: Once | INTRAVENOUS | Status: AC
Start: 2016-04-08 — End: 2016-04-08
  Administered 2016-04-08: 250 mL via INTRAVENOUS

## 2016-04-08 MED ORDER — TIGECYCLINE 50 MG IV SOLR
100.0000 mg | Freq: Once | INTRAVENOUS | Status: AC
  Administered 2016-04-08: 100 mg via INTRAVENOUS
  Filled 2016-04-08: qty 100

## 2016-04-08 MED ORDER — SODIUM CHLORIDE 0.9 % IV BOLUS (SEPSIS): 250.0000 mL | Freq: Once | INTRAVENOUS | Status: DC

## 2016-04-08 MED ORDER — SODIUM CHLORIDE 0.9 % IV SOLN
50.0000 mg | Freq: Two times a day (BID) | INTRAVENOUS | Status: DC
  Administered 2016-04-09 – 2016-04-11 (×6): 50 mg via INTRAVENOUS
  Filled 2016-04-08 (×7): qty 100

## 2016-04-08 NOTE — Progress Notes (Signed)
Subjective:  Feels better color is better as well  Antibiotics:  Anti-infectives    Start     Dose/Rate Route Frequency Ordered Stop   04/07/16 1300  ampicillin (OMNIPEN) 2 g in sodium chloride 0.9 % 50 mL IVPB     2 g 150 mL/hr over 20 Minutes Intravenous Every 6 hours 04/07/16 1201     04/06/16 2100  vancomycin (VANCOCIN) 50 mg/mL oral solution 125 mg     125 mg Per Tube Every 6 hours 04/06/16 2031     04/06/16 2100  vancomycin (VANCOCIN) IVPB 1000 mg/200 mL premix  Status:  Discontinued     1,000 mg 200 mL/hr over 60 Minutes Intravenous Every 24 hours 04/06/16 2035 04/07/16 1118   04/06/16 0930  ceftazidime-avibactam (AVYCAZ) 1.25 g in dextrose 5 % 50 mL IVPB     1.25 g 25 mL/hr over 2 Hours Intravenous Every 8 hours 04/06/16 0920     04/05/16 1100  vancomycin (VANCOCIN) IVPB 1000 mg/200 mL premix  Status:  Discontinued     1,000 mg 200 mL/hr over 60 Minutes Intravenous Every 24 hours 04/05/16 1015 04/06/16 0920   04/05/16 1000  ceFEPIme (MAXIPIME) 1 g in dextrose 5 % 50 mL IVPB  Status:  Discontinued     1 g 100 mL/hr over 30 Minutes Intravenous Every 24 hours 04/04/16 1123 04/04/16 1152   04/05/16 0500  piperacillin-tazobactam (ZOSYN) IVPB 3.375 g  Status:  Discontinued     3.375 g 12.5 mL/hr over 240 Minutes Intravenous Every 8 hours 04/04/16 2200 04/06/16 0920   04/04/16 2230  piperacillin-tazobactam (ZOSYN) IVPB 3.375 g     3.375 g 100 mL/hr over 30 Minutes Intravenous  Once 04/04/16 2200 04/04/16 2300   04/04/16 2200  clindamycin (CLEOCIN) IVPB 600 mg  Status:  Discontinued     600 mg 100 mL/hr over 30 Minutes Intravenous Every 8 hours 04/04/16 2153 04/04/16 2211   04/04/16 1400  ceFAZolin (ANCEF) IVPB 1 g/50 mL premix  Status:  Discontinued     1 g 100 mL/hr over 30 Minutes Intravenous Every 8 hours 04/04/16 1152 04/04/16 2152   04/04/16 1400  vancomycin (VANCOCIN) IVPB 750 mg/150 ml premix  Status:  Discontinued     750 mg 150 mL/hr over 60 Minutes  Intravenous Every 24 hours 04/04/16 1252 04/05/16 1015   04/01/16 1200  vancomycin (VANCOCIN) 50 mg/mL oral solution 125 mg  Status:  Discontinued     125 mg Oral Every 6 hours 04/01/16 1040 04/04/16 1147   04/01/16 1200  ceFEPIme (MAXIPIME) 1 g in dextrose 5 % 50 mL IVPB  Status:  Discontinued     1 g 100 mL/hr over 30 Minutes Intravenous Every 12 hours 04/01/16 1130 04/04/16 1123   04/01/16 1000  fluconazole (DIFLUCAN) tablet 200 mg  Status:  Discontinued     200 mg Per Tube Daily 04/01/16 0322 04/02/16 1246   04/01/16 0800  piperacillin-tazobactam (ZOSYN) IVPB 3.375 g  Status:  Discontinued     3.375 g 12.5 mL/hr over 240 Minutes Intravenous Every 8 hours 04/01/16 0200 04/01/16 0729   03/31/16 2330  piperacillin-tazobactam (ZOSYN) IVPB 3.375 g     3.375 g 100 mL/hr over 30 Minutes Intravenous  Once 03/31/16 2320 04/01/16 0220      Medications: Scheduled Meds: . ampicillin (OMNIPEN) IV  2 g Intravenous Q6H  . busPIRone  10 mg Per Tube TID  . ceftazidime avibactam (AVYCAZ) IVPB  1.25 g Intravenous Q8H  .  chlorhexidine gluconate (MEDLINE KIT)  15 mL Mouth Rinse BID  . cholestyramine  8 g Per Tube 2 times per day  . dexamethasone  2 mg Per Tube Daily  . enoxaparin (LOVENOX) injection  40 mg Subcutaneous Q24H  . famotidine  20 mg Per Tube Daily  . feeding supplement (PRO-STAT SUGAR FREE 64)  30 mL Per Tube BID  . fentaNYL (SUBLIMAZE) injection  25 mcg Intravenous Once  . free water  100 mL Per Tube Q8H  . Gerhardt's butt cream   Topical QID  . insulin aspart  0-9 Units Subcutaneous Q4H  . insulin glargine  9 Units Subcutaneous QHS  . ipratropium-albuterol  3 mL Nebulization TID  . lidocaine-EPINEPHrine  20 mL Intradermal Once  . mouth rinse  15 mL Mouth Rinse QID  . mirtazapine  15 mg Per Tube QHS  . multivitamin  15 mL Per Tube Daily  . sodium chloride flush  3 mL Intravenous Q12H  . traMADol  25 mg Per Tube BID  . vancomycin  125 mg Per Tube Q6H   Continuous Infusions: .  feeding supplement (GLUCERNA 1.2 CAL) 1,000 mL (04/07/16 2000)   PRN Meds:.fentaNYL (SUBLIMAZE) injection, HYDROcodone-acetaminophen, loperamide, LORazepam    Objective: Weight change: 3 lb 12 oz (1.7 kg)  Intake/Output Summary (Last 24 hours) at 04/08/16 1312 Last data filed at 04/08/16 1000  Gross per 24 hour  Intake             1385 ml  Output             1255 ml  Net              130 ml   Blood pressure (!) 116/58, pulse 95, temperature 97.9 F (36.6 C), temperature source Oral, resp. rate (!) 27, height 5' 1"  (1.549 m), weight 133 lb 2.5 oz (60.4 kg), SpO2 100 %. Temp:  [97.6 F (36.4 C)-99 F (37.2 C)] 97.9 F (36.6 C) (11/26 1123) Pulse Rate:  [76-100] 95 (11/26 1123) Resp:  [14-30] 27 (11/26 1123) BP: (95-120)/(45-91) 116/58 (11/26 1123) SpO2:  [99 %-100 %] 100 % (11/26 1123) FiO2 (%):  [40 %] 40 % (11/26 1123) Weight:  [133 lb 2.5 oz (60.4 kg)] 133 lb 2.5 oz (60.4 kg) (11/26 0200)  Physical Exam: General: Alert and awake, oriented x3, On the ventilator, improved color HEENT: anicteric sclera,  EOMI CVS regular rate, normal r,  no murmur rubs or gallops Chest: Fairly clear to auscultation bilaterally, no wheezing, rales or rhonchi Abdomen: Wounds  not examined by me today but by surgery  Skin: no rashes  Neuro: nonfocal  CBC:  CBC Latest Ref Rng & Units 04/08/2016 04/07/2016 04/06/2016  WBC 4.0 - 10.5 K/uL 9.1 14.1(H) 14.3(H)  Hemoglobin 12.0 - 15.0 g/dL 7.1(L) 8.6(L) 9.0(L)  Hematocrit 36.0 - 46.0 % 21.8(L) 26.8(L) 27.5(L)  Platelets 150 - 400 K/uL 109(L) 128(L) 133(L)      BMET  Recent Labs  04/07/16 0719 04/08/16 0305  NA 134* 135  K 4.6 4.3  CL 111 110  CO2 18* 20*  GLUCOSE 128* 138*  BUN 32* 30*  CREATININE 0.60 0.50  CALCIUM 7.7* 7.6*     Liver Panel  No results for input(s): PROT, ALBUMIN, AST, ALT, ALKPHOS, BILITOT, BILIDIR, IBILI in the last 72 hours.     Sedimentation Rate No results for input(s): ESRSEDRATE in the last 72  hours. C-Reactive Protein No results for input(s): CRP in the last 72 hours.  Micro Results: Recent  Results (from the past 720 hour(s))  Culture, respiratory (NON-Expectorated)     Status: None   Collection Time: 04/01/16  6:46 AM  Result Value Ref Range Status   Specimen Description SPUTUM  Final   Special Requests NONE  Final   Gram Stain   Final    ABUNDANT WBC PRESENT, PREDOMINANTLY PMN ABUNDANT GRAM NEGATIVE RODS FEW GRAM POSITIVE RODS RARE GRAM POSITIVE COCCI IN PAIRS    Culture MULTIPLE ORGANISMS PRESENT, NONE PREDOMINANT  Final   Report Status 04/03/2016 FINAL  Final  MRSA PCR Screening     Status: None   Collection Time: 04/01/16  7:42 AM  Result Value Ref Range Status   MRSA by PCR NEGATIVE NEGATIVE Final    Comment:        The GeneXpert MRSA Assay (FDA approved for NASAL specimens only), is one component of a comprehensive MRSA colonization surveillance program. It is not intended to diagnose MRSA infection nor to guide or monitor treatment for MRSA infections.   Culture, blood (routine x 2)     Status: Abnormal (Preliminary result)   Collection Time: 04/01/16 10:46 AM  Result Value Ref Range Status   Specimen Description BLOOD PICC LINE  Final   Special Requests BOTTLES DRAWN AEROBIC AND ANAEROBIC 5CC  Final   Culture  Setup Time   Final    GRAM NEGATIVE RODS AEROBIC BOTTLE ONLY CRITICAL RESULT CALLED TO, READ BACK BY AND VERIFIED WITH: T STONE,PHARMD AT 0909 04/06/16 BY L BENFIELD    Culture (A)  Final    KLEBSIELLA PNEUMONIAE CARBAPENEMASE RESISTANT ENTEROBACTERIACAE CRITICAL RESULT CALLED TO, READ BACK BY AND VERIFIED WITH: Darcus Austin PREVENTION AT 4765 04/08/16 BY L BENFIELD REFFERED TO [facility] FOR AVYCAZ AND TIGECYCLINE SUSCEPTIBILITIES    Report Status PENDING  Incomplete   Organism ID, Bacteria KLEBSIELLA PNEUMONIAE  Final      Susceptibility   Klebsiella pneumoniae - MIC*    AMPICILLIN >=32 RESISTANT Resistant     CEFAZOLIN >=64  RESISTANT Resistant     CEFEPIME 4 RESISTANT Resistant     CEFTAZIDIME 32 RESISTANT Resistant     CEFTRIAXONE 32 RESISTANT Resistant     CIPROFLOXACIN >=4 RESISTANT Resistant     GENTAMICIN >=16 RESISTANT Resistant     IMIPENEM >=16 RESISTANT Resistant     TRIMETH/SULFA <=20 SENSITIVE Sensitive     AMPICILLIN/SULBACTAM >=32 RESISTANT Resistant     PIP/TAZO >=128 RESISTANT Resistant     Extended ESBL NEGATIVE Sensitive     * KLEBSIELLA PNEUMONIAE  Blood Culture ID Panel (Reflexed)     Status: Abnormal   Collection Time: 04/01/16 10:46 AM  Result Value Ref Range Status   Enterococcus species NOT DETECTED NOT DETECTED Final   Listeria monocytogenes NOT DETECTED NOT DETECTED Final   Staphylococcus species NOT DETECTED NOT DETECTED Final   Staphylococcus aureus NOT DETECTED NOT DETECTED Final   Streptococcus species NOT DETECTED NOT DETECTED Final   Streptococcus agalactiae NOT DETECTED NOT DETECTED Final   Streptococcus pneumoniae NOT DETECTED NOT DETECTED Final   Streptococcus pyogenes NOT DETECTED NOT DETECTED Final   Acinetobacter baumannii NOT DETECTED NOT DETECTED Final   Enterobacteriaceae species DETECTED (A) NOT DETECTED Final    Comment: CRITICAL RESULT CALLED TO, READ BACK BY AND VERIFIED WITH: T STONE,PHARMD AT 0909 04/06/16 BY L BENFIELD    Enterobacter cloacae complex NOT DETECTED NOT DETECTED Final   Escherichia coli NOT DETECTED NOT DETECTED Final   Klebsiella oxytoca NOT DETECTED NOT DETECTED Final  Klebsiella pneumoniae DETECTED (A) NOT DETECTED Final    Comment: CRITICAL RESULT CALLED TO, READ BACK BY AND VERIFIED WITH: T STONE,PHARMD AT 0909 04/06/16 BY L BENFIELD    Proteus species NOT DETECTED NOT DETECTED Final   Serratia marcescens NOT DETECTED NOT DETECTED Final   Carbapenem resistance DETECTED (A) NOT DETECTED Final    Comment: CRITICAL RESULT CALLED TO, READ BACK BY AND VERIFIED WITH: T STONE,PHARMD AT 0909 04/06/16 BY L BENFIELD    Haemophilus  influenzae NOT DETECTED NOT DETECTED Final   Neisseria meningitidis NOT DETECTED NOT DETECTED Final   Pseudomonas aeruginosa NOT DETECTED NOT DETECTED Final   Candida albicans NOT DETECTED NOT DETECTED Final   Candida glabrata NOT DETECTED NOT DETECTED Final   Candida krusei NOT DETECTED NOT DETECTED Final   Candida parapsilosis NOT DETECTED NOT DETECTED Final   Candida tropicalis NOT DETECTED NOT DETECTED Final  Culture, blood (routine x 2)     Status: None   Collection Time: 04/01/16 10:58 AM  Result Value Ref Range Status   Specimen Description BLOOD LEFT HAND  Final   Special Requests IN PEDIATRIC BOTTLE 3CC  Final   Culture NO GROWTH 5 DAYS  Final   Report Status 04/06/2016 FINAL  Final  Culture, Urine     Status: Abnormal   Collection Time: 04/01/16 12:00 PM  Result Value Ref Range Status   Specimen Description URINE, CATHETERIZED  Final   Special Requests NONE  Final   Culture MULTIPLE SPECIES PRESENT, SUGGEST RECOLLECTION (A)  Final   Report Status 04/02/2016 FINAL  Final  C difficile quick scan w PCR reflex     Status: Abnormal   Collection Time: 04/02/16  8:25 AM  Result Value Ref Range Status   C Diff antigen POSITIVE (A) NEGATIVE Final   C Diff toxin NEGATIVE NEGATIVE Final   C Diff interpretation Results are indeterminate. See PCR results.  Final  Clostridium Difficile by PCR     Status: Abnormal   Collection Time: 04/02/16  8:25 AM  Result Value Ref Range Status   Toxigenic C Difficile by pcr POSITIVE (A) NEGATIVE Final    Comment: Positive for toxigenic C. difficile with little to no toxin production. Only treat if clinical presentation suggests symptomatic illness.  Culture, Urine     Status: Abnormal   Collection Time: 04/02/16  5:17 PM  Result Value Ref Range Status   Specimen Description URINE, CATHETERIZED  Final   Special Requests Cefepime  Final   Culture MULTIPLE SPECIES PRESENT, SUGGEST RECOLLECTION (A)  Final   Report Status 04/04/2016 FINAL  Final    Aerobic Culture (superficial specimen)     Status: None   Collection Time: 04/04/16  4:08 PM  Result Value Ref Range Status   Specimen Description FLUID  Final   Special Requests G TUBE DISCHARGE SWAB  Final   Gram Stain   Final    ABUNDANT WBC PRESENT,BOTH PMN AND MONONUCLEAR ABUNDANT GRAM POSITIVE RODS MODERATE GRAM NEGATIVE RODS    Culture   Final    MODERATE KLEBSIELLA PNEUMONIAE CARBAPENEMASE RESISTANT ENTEROBACTERIACAE MODERATE VANCOMYCIN RESISTANT ENTEROCOCCUS CRITICAL RESULT CALLED TO, READ BACK BY AND VERIFIED WITH: Darcus Austin PREVENTION AT 5956 04/08/16 BY L BENFIELD    Report Status 04/08/2016 FINAL  Final   Organism ID, Bacteria VANCOMYCIN RESISTANT ENTEROCOCCUS  Final   Organism ID, Bacteria KLEBSIELLA PNEUMONIAE  Final      Susceptibility   Klebsiella pneumoniae - MIC*    AMPICILLIN >=32 RESISTANT Resistant  CEFAZOLIN >=64 RESISTANT Resistant     CEFEPIME 4 RESISTANT Resistant     CEFTAZIDIME 32 RESISTANT Resistant     CEFTRIAXONE 32 RESISTANT Resistant     CIPROFLOXACIN >=4 RESISTANT Resistant     GENTAMICIN >=16 RESISTANT Resistant     IMIPENEM >=16 RESISTANT Resistant     TRIMETH/SULFA >=320 RESISTANT Resistant     AMPICILLIN/SULBACTAM >=32 RESISTANT Resistant     PIP/TAZO >=128 RESISTANT Resistant     Extended ESBL NEGATIVE Sensitive     * MODERATE KLEBSIELLA PNEUMONIAE   Vancomycin resistant enterococcus - MIC*    AMPICILLIN <=2 SENSITIVE Sensitive     VANCOMYCIN >=32 RESISTANT Resistant     GENTAMICIN SYNERGY RESISTANT Resistant     LINEZOLID 1 SENSITIVE Sensitive     * MODERATE VANCOMYCIN RESISTANT ENTEROCOCCUS  Aerobic Culture (superficial specimen)     Status: None   Collection Time: 04/04/16  4:10 PM  Result Value Ref Range Status   Specimen Description ABDOMEN  Final   Special Requests   Final     CHECK FOR MRSA SKIN SWAB ON THE SITE OF THE ABDOMEN CELLULITIS   Gram Stain   Final    RARE WBC PRESENT, PREDOMINANTLY  MONONUCLEAR RARE GRAM NEGATIVE RODS RARE GRAM POSITIVE RODS    Culture   Final    RARE KLEBSIELLA PNEUMONIAE CARBAPENEMASE RESISTANT ENTEROBACTERIACAE FEW VANCOMYCIN RESISTANT ENTEROCOCCUS CRITICAL RESULT CALLED TO, READ BACK BY AND VERIFIED WITH: Darcus Austin PREVENTION AT 9983 04/08/16 BY L BENFIELD    Report Status 04/08/2016 FINAL  Final   Organism ID, Bacteria KLEBSIELLA PNEUMONIAE  Final   Organism ID, Bacteria VANCOMYCIN RESISTANT ENTEROCOCCUS  Final      Susceptibility   Klebsiella pneumoniae - MIC*    AMPICILLIN >=32 RESISTANT Resistant     CEFAZOLIN >=64 RESISTANT Resistant     CEFEPIME 8 RESISTANT Resistant     CEFTAZIDIME 32 RESISTANT Resistant     CEFTRIAXONE >=64 RESISTANT Resistant     CIPROFLOXACIN >=4 RESISTANT Resistant     GENTAMICIN >=16 RESISTANT Resistant     IMIPENEM 8 RESISTANT Resistant     TRIMETH/SULFA >=320 RESISTANT Resistant     AMPICILLIN/SULBACTAM >=32 RESISTANT Resistant     PIP/TAZO >=128 RESISTANT Resistant     Extended ESBL NEGATIVE Sensitive     * RARE KLEBSIELLA PNEUMONIAE   Vancomycin resistant enterococcus - MIC*    AMPICILLIN <=2 SENSITIVE Sensitive     VANCOMYCIN >=32 RESISTANT Resistant     GENTAMICIN SYNERGY RESISTANT Resistant     LINEZOLID 1 SENSITIVE Sensitive     * FEW VANCOMYCIN RESISTANT ENTEROCOCCUS  Aerobic Culture (superficial specimen)     Status: None   Collection Time: 04/05/16  1:50 PM  Result Value Ref Range Status   Specimen Description ABSCESS LEFT ABDOMEN  Final   Special Requests LEFT ABDOMINAL WALL ABSCESS  Final   Gram Stain   Final    FEW WBC PRESENT, PREDOMINANTLY PMN NO ORGANISMS SEEN    Culture   Final    RARE KLEBSIELLA PNEUMONIAE CARBAPENEMASE RESISTANT ENTEROBACTERIACAE CRITICAL RESULT CALLED TO, READ BACK BY AND VERIFIED WITH: Darcus Austin PREVENTION AT 3825 04/08/16 BY L BENFIELD    Report Status 04/08/2016 FINAL  Final   Organism ID, Bacteria KLEBSIELLA PNEUMONIAE  Final       Susceptibility   Klebsiella pneumoniae - MIC*    AMPICILLIN >=32 RESISTANT Resistant     CEFAZOLIN >=64 RESISTANT Resistant     CEFEPIME 4 RESISTANT Resistant  CEFTAZIDIME 32 RESISTANT Resistant     CEFTRIAXONE 32 RESISTANT Resistant     CIPROFLOXACIN >=4 RESISTANT Resistant     GENTAMICIN >=16 RESISTANT Resistant     IMIPENEM 8 RESISTANT Resistant     TRIMETH/SULFA >=320 RESISTANT Resistant     AMPICILLIN/SULBACTAM >=32 RESISTANT Resistant     PIP/TAZO >=128 RESISTANT Resistant     Extended ESBL NEGATIVE Sensitive     * RARE KLEBSIELLA PNEUMONIAE  Culture, blood (Routine X 2) w Reflex to ID Panel     Status: None (Preliminary result)   Collection Time: 04/07/16  1:20 PM  Result Value Ref Range Status   Specimen Description BLOOD BLOOD RIGHT HAND  Final   Special Requests IN PEDIATRIC BOTTLE 1CC  Final   Culture NO GROWTH < 24 HOURS  Final   Report Status PENDING  Incomplete  Culture, blood (Routine X 2) w Reflex to ID Panel     Status: None (Preliminary result)   Collection Time: 04/07/16  1:35 PM  Result Value Ref Range Status   Specimen Description BLOOD BLOOD LEFT HAND  Final   Special Requests IN PEDIATRIC BOTTLE 4CC  Final   Culture NO GROWTH < 24 HOURS  Final   Report Status PENDING  Incomplete    Studies/Results: No results found.    Assessment/Plan:  INTERVAL HISTORY: He is growing Klebsiella pneumonia from multiple sites likely the K PC organism also with VRE that is sensitive to ampicillin  Is growing multidrug-resistant Klebsiella pneumonia that is CARBAPENEM R multiple sites see below   Principal Problem:   Hyperkalemia Active Problems:   Anemia   Pressure injury of skin   Ventilator associated pneumonia (HCC)   Hyperglycemia   Hyponatremia   Ventilator dependence ([place])   UTI due to Klebsiella species   Recurrent colitis due to Clostridium difficile   Abdominal wall cellulitis   PICC (peripherally inserted central catheter) in place   Infection  due to carbapenemase producing organism   Pneumonia of right lower lobe due to Klebsiella pneumoniae ([place])   Abnormal amount of sputum   HCAP (healthcare-associated pneumonia)   VRE (vancomycin-resistant Enterococci) infection   Charitie Hinote is a 82 y.o. female with  VDRF, COPD, abdominal abscess evidence for VAP, and possibly Clostridium difficile, now found to have K PC klebsiella pneumonia bacteremia, KPC Klebsiella    #1 K PC klebsiella bacteremia: the bloodstream isolate was listed as being sensitive to Bactrim though everywhere else we have gotten cultures of this organism does been found to be resistant.    Continue AVYCAZ PICC line is out  Repeat blood cultures growth  Pharmacy is asking microbiology to have this ordered some tested against AVYCAZ, COLISTIN TYGACIL  #2 Abdominal abscess: Status post I&D by surgery this is growing moderate Klebsiella pneumoniae KPC but R Bactrim, AMP S VRE  Continue  the AVYCAZ , and I will change from AMP to tygacil to avoid double betams and risk for seizure   #3 ventilator associated pneumonia: Is very broadly covered   #4 cdi Antigen positive TOXIn negative: [place] she was in the midst of treatment when she was tested I would continue treated with oral vancomycin and complete 2 weeks of therapy.  Her Baxter Flattery is back tomorrow    LOS: 8 days   Alcide Evener 04/08/2016, 1:12 PM

## 2016-04-08 NOTE — Progress Notes (Signed)
Subjective: Pt awake / alert on trach   Objective: Vital signs in last 24 hours: Temp:  [97.6 F (36.4 C)-99 F (37.2 C)] 98.4 F (36.9 C) (11/26 0411) Pulse Rate:  [76-107] 86 (11/26 0803) Resp:  [14-37] 22 (11/26 0803) BP: (95-120)/(45-91) 120/91 (11/26 0803) SpO2:  [99 %-100 %] 100 % (11/26 0803) FiO2 (%):  [40 %] 40 % (11/26 0803) Weight:  [60.4 kg (133 lb 2.5 oz)] 60.4 kg (133 lb 2.5 oz) (11/26 0200) Last BM Date: 04/07/16  Intake/Output from previous day: 11/25 0701 - 11/26 0700 In: 2155 [NG/GT:1005; IV Piggyback:350] Out: 1365 [Urine:1165; Stool:200] Intake/Output this shift: No intake/output data recorded.  Incision/Wound:wound open  Clean  Less drainage  Cultures reviewed  Klebsiella and VRE  Pt on isolation   Lab Results:   Recent Labs  04/07/16 0719 04/08/16 0305  WBC 14.1* 9.1  HGB 8.6* 7.1*  HCT 26.8* 21.8*  PLT 128* 109*   BMET  Recent Labs  04/07/16 0719 04/08/16 0305  NA 134* 135  K 4.6 4.3  CL 111 110  CO2 18* 20*  GLUCOSE 128* 138*  BUN 32* 30*  CREATININE 0.60 0.50  CALCIUM 7.7* 7.6*   PT/INR No results for input(s): LABPROT, INR in the last 72 hours. ABG No results for input(s): PHART, HCO3 in the last 72 hours.  Invalid input(s): PCO2, PO2  Studies/Results: No results found.  Anti-infectives: Anti-infectives    Start     Dose/Rate Route Frequency Ordered Stop   04/07/16 1300  ampicillin (OMNIPEN) 2 g in sodium chloride 0.9 % 50 mL IVPB     2 g 150 mL/hr over 20 Minutes Intravenous Every 6 hours 04/07/16 1201     04/06/16 2100  vancomycin (VANCOCIN) 50 mg/mL oral solution 125 mg     125 mg Per Tube Every 6 hours 04/06/16 2031     04/06/16 2100  vancomycin (VANCOCIN) IVPB 1000 mg/200 mL premix  Status:  Discontinued     1,000 mg 200 mL/hr over 60 Minutes Intravenous Every 24 hours 04/06/16 2035 04/07/16 1118   04/06/16 0930  ceftazidime-avibactam (AVYCAZ) 1.25 g in dextrose 5 % 50 mL IVPB     1.25 g 25 mL/hr over 2  Hours Intravenous Every 8 hours 04/06/16 0920     04/05/16 1100  vancomycin (VANCOCIN) IVPB 1000 mg/200 mL premix  Status:  Discontinued     1,000 mg 200 mL/hr over 60 Minutes Intravenous Every 24 hours 04/05/16 1015 04/06/16 0920   04/05/16 1000  ceFEPIme (MAXIPIME) 1 g in dextrose 5 % 50 mL IVPB  Status:  Discontinued     1 g 100 mL/hr over 30 Minutes Intravenous Every 24 hours 04/04/16 1123 04/04/16 1152   04/05/16 0500  piperacillin-tazobactam (ZOSYN) IVPB 3.375 g  Status:  Discontinued     3.375 g 12.5 mL/hr over 240 Minutes Intravenous Every 8 hours 04/04/16 2200 04/06/16 0920   04/04/16 2230  piperacillin-tazobactam (ZOSYN) IVPB 3.375 g     3.375 g 100 mL/hr over 30 Minutes Intravenous  Once 04/04/16 2200 04/04/16 2300   04/04/16 2200  clindamycin (CLEOCIN) IVPB 600 mg  Status:  Discontinued     600 mg 100 mL/hr over 30 Minutes Intravenous Every 8 hours 04/04/16 2153 04/04/16 2211   04/04/16 1400  ceFAZolin (ANCEF) IVPB 1 g/50 mL premix  Status:  Discontinued     1 g 100 mL/hr over 30 Minutes Intravenous Every 8 hours 04/04/16 1152 04/04/16 2152   04/04/16 1400  vancomycin (  VANCOCIN) IVPB 750 mg/150 ml premix  Status:  Discontinued     750 mg 150 mL/hr over 60 Minutes Intravenous Every 24 hours 04/04/16 1252 04/05/16 1015   04/01/16 1200  vancomycin (VANCOCIN) 50 mg/mL oral solution 125 mg  Status:  Discontinued     125 mg Oral Every 6 hours 04/01/16 1040 04/04/16 1147   04/01/16 1200  ceFEPIme (MAXIPIME) 1 g in dextrose 5 % 50 mL IVPB  Status:  Discontinued     1 g 100 mL/hr over 30 Minutes Intravenous Every 12 hours 04/01/16 1130 04/04/16 1123   04/01/16 1000  fluconazole (DIFLUCAN) tablet 200 mg  Status:  Discontinued     200 mg Per Tube Daily 04/01/16 0322 04/02/16 1246   04/01/16 0800  piperacillin-tazobactam (ZOSYN) IVPB 3.375 g  Status:  Discontinued     3.375 g 12.5 mL/hr over 240 Minutes Intravenous Every 8 hours 04/01/16 0200 04/01/16 0729   03/31/16 2330   piperacillin-tazobactam (ZOSYN) IVPB 3.375 g     3.375 g 100 mL/hr over 30 Minutes Intravenous  Once 03/31/16 2320 04/01/16 0220      Assessment/Plan: S/P I&D abdominal wall abscess at G tube site - Improving. Continue abx per ID and wet to dry dressing changes. CX shows klebsiella and VRE PNA/vent - per primary service/ID   LOS: 8 days    Donyel Nester A. 04/08/2016

## 2016-04-08 NOTE — Progress Notes (Addendum)
  Date: 04/08/2016  Patient name: Janice Watson  Medical record number: [id number]  Date of birth: 03-Apr-1932   I have seen and evaluated [name] and the plan of care was discussed with the house staff. Please see Dr. Kandice HamsSvalina's note for complete details. I concur with her findings.   She has an unclear source of blood loss, but does have chronic infection and persistent drainage from I&D around G tube as likely source.  1 Unit PRBC today and monitor for improvement.  She is now being treated for VAP, carbapenem resistant klebsiella bacteremia, abdominal wall cellulitis, Cdiff colitis and a complicated UTI.  She is ventilator dependent and chronically ill and frail.    Inez CatalinaEmily B Mullen, MD 04/08/2016, 11:04 AM

## 2016-04-08 NOTE — Progress Notes (Signed)
Blood stopped due to infiltration of I.V. Blood bank and MD aware

## 2016-04-08 NOTE — Progress Notes (Signed)
Subjective:  Patient complaining of pain in her bottom, and some stable abdominal discomfort. She denies shortness of breath, dizziness, or shakiness. She wonders why her blood level has dropped today and reaffirms that she feels fine.   Objective:  Vital signs in last 24 hours: Vitals:   04/08/16 0500 04/08/16 0600 04/08/16 0700 04/08/16 0803  BP: (!) 120/57 (!) 118/56 (!) 108/50 (!) 120/91  Pulse: 83 85 81 86  Resp: 19 20 19  (!) 22  Temp:    98 F (36.7 C)  TempSrc:    Oral  SpO2: 100% 100% 100% 100%  Weight:      Height:       Constitutional: NAD, VS reviewed CV: RRR, no murmurs, rubs or gallops appreciated, no LE edema Resp: trach in place with sputum and secretions present; mechanical breath sounds present bilaterally, with rhonchi, no wheezing Abd: G tube in place, soft, +BS, Bandage over abdominal abscess wound with serosanguinous discharge; erythema is still significant in surrounding area with associated tenderness.  GU: Foley cath in place, rectal tube in place Neuro: Alert; neuro grossly intact; asking appropriate questions  Assessment/Plan:  Principal Problem:   Hyperkalemia Active Problems:   Anemia   Pressure injury of skin   Ventilator associated pneumonia (HCC)   Hyperglycemia   Hyponatremia   Ventilator dependence (HCC)   UTI due to Klebsiella species   Recurrent colitis due to Clostridium difficile   Abdominal wall cellulitis   PICC (peripherally inserted central catheter) in place   Infection due to carbapenemase producing organism   Pneumonia of right lower lobe due to Klebsiella pneumoniae (HCC)   Abnormal amount of sputum   HCAP (healthcare-associated pneumonia)   VRE (vancomycin-resistant Enterococci) infection  Carbapenem resistant K. pneumoniae bacteriemia:  Patient remains afebrile; PICC line is out. Repeat blood cultures since PICC removal and antibiotic broadening are NGTD <12hrs.  --continue Avycaz (dosing per pharm) --f/u repeat BCx  from 11/25  Abdominal wall cellulitis and abscess:  S/p I&D per surgery. Cultures growing K pneumoniae and enterococcus with sensitivities pending  --ceftazidime/avibactam; ampicillin --continue monitoring, surgery following --f/u wound culture  Complicated UTI: UCx from facility showed K pneumoniae growth; repeat cultures here have not had good results as too many organisms growing. --ceftazidime/avibactam per pharmacy, which is treating the kleb. bacteremia  Acute on Chronic anemia: FOBT was negative, UA was positive for RBC's likely due to trauma as foley was just changed prior to study. Hgb drop to 7.1 this morning with no obvious signs of bleeding; there is some serosanguinous drainage from abscess wound site but would not expect this much of a drop. Surgery monitoring wound and states less drainage today from yesterday.  --transfuse 1 UpRBC; f/u post transfusion CBC --continue to monitor for signs and symptoms of bleeding --f/u AM CBC  Ventilator Dependent - VAP:  CT 11/21 showing LLL consolidation consistent with VAP; sputum gram stain showing G- rods, G+ rods, G+ cocci in pairs. Sputum culture shows multiple organisms present with none predominant. Has had coverage x 8 days     --ceftazidime/avibactam --duonebs q6hrs --we have consulted pulmonary for assistance in assessing vent need and possible weaning - appreciate their help  G-tube dependent:  -continue tube feeds  C difficile colonization:   C dif testing showed +antigen, - toxin, and +PCR; colonization vs infection as patient was already on antibiotic coverage during time of testing. -ID restarted vanc per G tube.   T2DM: Change in lantus dosing yesteday due to hypoglycemic episode. This  AM CBG 138.  --continue Lantus 9qhs -- SSI - s  FEN: tube feeds per G tube per nutrition, free water 100ml per G tube CODE: DNR DVT PPx: lovenox  Dispo: Anticipated discharge in approximately 2-3 day(s).   Janice MarketGorica Iyania Denne,  MD 04/08/2016, 9:14 AM Pager [phone]

## 2016-04-09 DIAGNOSIS — J189 Pneumonia, unspecified organism: Secondary | ICD-10-CM

## 2016-04-09 DIAGNOSIS — Z1612 Extended spectrum beta lactamase (ESBL) resistance: Secondary | ICD-10-CM

## 2016-04-09 DIAGNOSIS — Z9889 Other specified postprocedural states: Secondary | ICD-10-CM

## 2016-04-09 DIAGNOSIS — A414 Sepsis due to anaerobes: Secondary | ICD-10-CM

## 2016-04-09 DIAGNOSIS — B961 Klebsiella pneumoniae [K. pneumoniae] as the cause of diseases classified elsewhere: Secondary | ICD-10-CM

## 2016-04-09 DIAGNOSIS — B952 Enterococcus as the cause of diseases classified elsewhere: Secondary | ICD-10-CM

## 2016-04-09 DIAGNOSIS — L03311 Cellulitis of abdominal wall: Secondary | ICD-10-CM

## 2016-04-09 DIAGNOSIS — Z1611 Resistance to penicillins: Secondary | ICD-10-CM

## 2016-04-09 DIAGNOSIS — D62 Acute posthemorrhagic anemia: Secondary | ICD-10-CM

## 2016-04-09 DIAGNOSIS — L02211 Cutaneous abscess of abdominal wall: Secondary | ICD-10-CM

## 2016-04-09 DIAGNOSIS — J9601 Acute respiratory failure with hypoxia: Secondary | ICD-10-CM

## 2016-04-09 DIAGNOSIS — I950 Idiopathic hypotension: Secondary | ICD-10-CM

## 2016-04-09 DIAGNOSIS — Z1639 Resistance to other specified antimicrobial drug: Secondary | ICD-10-CM

## 2016-04-09 DIAGNOSIS — Z9911 Dependence on respirator [ventilator] status: Secondary | ICD-10-CM

## 2016-04-09 DIAGNOSIS — A0471 Enterocolitis due to Clostridium difficile, recurrent: Secondary | ICD-10-CM

## 2016-04-09 DIAGNOSIS — Z1624 Resistance to multiple antibiotics: Secondary | ICD-10-CM

## 2016-04-09 DIAGNOSIS — Z931 Gastrostomy status: Secondary | ICD-10-CM

## 2016-04-09 LAB — TYPE AND SCREEN
ABO/RH(D): B NEG
Antibody Screen: NEGATIVE
UNIT DIVISION: 0
UNIT DIVISION: 0
Unit division: 0

## 2016-04-09 LAB — CBC
HEMATOCRIT: 26.2 % — AB (ref 36.0–46.0)
HEMATOCRIT: 31 % — AB (ref 36.0–46.0)
HEMOGLOBIN: 10 g/dL — AB (ref 12.0–15.0)
Hemoglobin: 8.6 g/dL — ABNORMAL LOW (ref 12.0–15.0)
MCH: 30.7 pg (ref 26.0–34.0)
MCH: 30.2 pg (ref 26.0–34.0)
MCHC: 32.8 g/dL (ref 30.0–36.0)
MCHC: 32.3 g/dL (ref 30.0–36.0)
MCV: 93.6 fL (ref 78.0–100.0)
MCV: 93.7 fL (ref 78.0–100.0)
PLATELETS: 118 10*3/uL — AB (ref 150–400)
Platelets: 151 10*3/uL (ref 150–400)
RBC: 2.8 MIL/uL — ABNORMAL LOW (ref 3.87–5.11)
RBC: 3.31 MIL/uL — AB (ref 3.87–5.11)
RDW: 17.4 % — AB (ref 11.5–15.5)
RDW: 17.7 % — AB (ref 11.5–15.5)
WBC: 9.7 10*3/uL (ref 4.0–10.5)
WBC: 15.2 10*3/uL — AB (ref 4.0–10.5)

## 2016-04-09 LAB — BASIC METABOLIC PANEL
Anion gap: 5 (ref 5–15)
BUN: 30 mg/dL — ABNORMAL HIGH (ref 6–20)
CALCIUM: 7.2 mg/dL — AB (ref 8.9–10.3)
CO2: 21 mmol/L — AB (ref 22–32)
CREATININE: 0.45 mg/dL (ref 0.44–1.00)
Chloride: 112 mmol/L — ABNORMAL HIGH (ref 101–111)
GLUCOSE: 89 mg/dL (ref 65–99)
Potassium: 4.2 mmol/L (ref 3.5–5.1)
Sodium: 138 mmol/L (ref 135–145)

## 2016-04-09 LAB — GLUCOSE, CAPILLARY
GLUCOSE-CAPILLARY: 143 mg/dL — AB (ref 65–99)
GLUCOSE-CAPILLARY: 81 mg/dL (ref 65–99)
GLUCOSE-CAPILLARY: 68 mg/dL (ref 65–99)
Glucose-Capillary: 78 mg/dL (ref 65–99)
Glucose-Capillary: 35 mg/dL — CL (ref 65–99)
Glucose-Capillary: 77 mg/dL (ref 65–99)
Glucose-Capillary: 96 mg/dL (ref 65–99)
Glucose-Capillary: 169 mg/dL — ABNORMAL HIGH (ref 65–99)

## 2016-04-09 MED ORDER — DEXTROSE-NACL 5-0.9 % IV SOLN
INTRAVENOUS | Status: DC
  Administered 2016-04-09 – 2016-04-11 (×3): via INTRAVENOUS

## 2016-04-09 MED ORDER — HYDROCORTISONE 5 MG PO TABS
5.0000 mg | ORAL_TABLET | ORAL | Status: DC
  Administered 2016-04-09 – 2016-04-11 (×3): 5 mg via ORAL
  Filled 2016-04-09 (×3): qty 1

## 2016-04-09 MED ORDER — FENTANYL CITRATE (PF) 100 MCG/2ML IJ SOLN
25.0000 ug | Freq: Once | INTRAMUSCULAR | Status: AC
  Administered 2016-04-09: 25 ug via INTRAVENOUS

## 2016-04-09 MED ORDER — INSULIN GLARGINE 100 UNIT/ML ~~LOC~~ SOLN
5.0000 [IU] | Freq: Every day | SUBCUTANEOUS | Status: DC
  Administered 2016-04-09: 5 [IU] via SUBCUTANEOUS
  Filled 2016-04-09 (×3): qty 0.05

## 2016-04-09 MED ORDER — LIDOCAINE HCL (PF) 1 % IJ SOLN
20.0000 mL | Freq: Once | INTRAMUSCULAR | Status: AC
Start: 2016-04-09 — End: 2016-04-09
  Administered 2016-04-09: 20 mL
  Filled 2016-04-09: qty 20

## 2016-04-09 MED ORDER — HYDROCORTISONE 5 MG PO TABS
15.0000 mg | ORAL_TABLET | ORAL | Status: DC
  Administered 2016-04-10 – 2016-04-11 (×2): 15 mg via ORAL
  Filled 2016-04-09 (×4): qty 1

## 2016-04-09 MED ORDER — SODIUM CHLORIDE 0.9 % IV BOLUS (SEPSIS)
250.0000 mL | Freq: Once | INTRAVENOUS | Status: AC
  Administered 2016-04-09: 250 mL via INTRAVENOUS

## 2016-04-09 NOTE — Progress Notes (Signed)
[facility] for Infectious Disease    Date of Admission:  03/31/2016   Total days of antibiotics 10        Day 3 ceftaz-avibactam        Day 1 tigecycline        Day 8 oral vanco   ID: Janice Watson is a 82 y.o. 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 678m liquid stool since this am shift  Medications:  . busPIRone  10 mg Per Tube TID  . ceftazidime avibactam (AVYCAZ) IVPB  1.25 g Intravenous Q8H  . chlorhexidine gluconate (MEDLINE KIT)  15 mL Mouth Rinse BID  . cholestyramine  8 g Per Tube 2 times per day  . dexamethasone  2 mg Per Tube Daily  . enoxaparin (LOVENOX) injection  40 mg Subcutaneous Q24H  . famotidine  20 mg Per Tube Daily  . feeding supplement (PRO-STAT SUGAR FREE 64)  30 mL Per Tube BID  . fentaNYL (SUBLIMAZE) injection  25 mcg Intravenous Once  . free water  100 mL Per Tube Q8H  . Gerhardt's butt cream   Topical QID  . [START ON 04/10/2016] hydrocortisone  15 mg Oral Q24H   And  . hydrocortisone  5 mg Oral Q24H  . insulin aspart  0-9 Units Subcutaneous Q4H  . insulin glargine  5 Units Subcutaneous QHS  . ipratropium-albuterol  3 mL Nebulization TID  .  lidocaine-EPINEPHrine  20 mL Intradermal Once  . mouth rinse  15 mL Mouth Rinse QID  . mirtazapine  15 mg Per Tube QHS  . multivitamin  15 mL Per Tube Daily  . sodium chloride flush  3 mL Intravenous Q12H  . tigecycline (TYGACIL) IVPB  50 mg Intravenous Q12H  . traMADol  25 mg Per Tube BID  . vancomycin  125 mg Per Tube Q6H    Objective: Vital signs in last 24 hours: Temp:  [97.6 F (36.4 C)-98.3 F (36.8 C)] 97.6 F (36.4 C) (11/27 0800) Pulse Rate:  [66-104] 85 (11/27 1100) Resp:  [13-28] 23 (11/27 1100) BP: (65-113)/(28-76) 80/61 (11/27 1100) SpO2:  [98 %-100 %] 100 % (11/27 1100) FiO2 (%):  [40 %] 40 % (11/27 0919)  Physical Exam  Constitutional:  oriented to person, place, and time. appears well-developed and her appropriate age. No distress.  HENT: [place]/AT, PERRLA, no scleral icterus Mouth/Throat: Oropharynx is clear and moist. No oropharyngeal exudate.  Neck: trach in place Cardiovascular: Normal rate, regular rhythm and normal heart sounds. Exam reveals no gallop and no friction rub.  No murmur heard.  Pulmonary/Chest: Effort normal and breath sounds normal. No respiratory distress.  has no wheezes.  Abdominal: Soft. Bowel sounds are decreased. Bandaged, has flexiseal in palce Lymphadenopathy: no  cervical adenopathy. No axillary adenopathy Neurological: alert and oriented to person, place, and time.  Skin: Skin is warm and dry. No rash noted. No erythema.  Psychiatric: a normal mood and affect.  behavior is normal.    Lab Results  Recent Labs  04/08/16 0305  04/09/16 0306 04/09/16 0930  WBC 9.1  < > 9.7 15.2*  HGB 7.1*  < > 8.6* 10.0*  HCT 21.8*  < > 26.2* 31.0*  NA 135  --  138  --   K 4.3  --  4.2  --   CL 110  --  112*  --   CO2 20*  --  21*  --   BUN 30*  --  30*  --   CREATININE 0.50  --  0.45  --   < > = values in this interval not displayed.  Microbiology: 11/25 blood cx ngtd 11/23 aerobic cx CRE Kleb pneumo and VRE 11/22 aerobic cx CRE kleb  pneumo and VRE 11/19 blood cx CRE kleb pneumo Studies/Results: Diffuse soft tissue air tracking along the left anterior abdominal wall, extending from the left upper quadrant of the left lower quadrant, with a small amount of associated fluid noted in the soft tissues. Findings raise concern for infection with a gas producing organism and necrotizing fasciitis. 2. G-tube noted in expected position. Contrast noted in the stomach. Stomach unremarkable in appearance. 3. Dense consolidation of the left lower lobe, concerning for pneumonia. Rounded opacity at the right middle lobe may reflect a small infectious cavitation, raising concern for atypical infection. Mild right basilar atelectasis noted. 4. Contrast noted within the distal esophagus, reflecting gastroesophageal reflux. 5. Small bilateral renal cysts seen. 6. Scattered aortic atherosclerosis. 7. **An incidental finding of potential clinical significance has been found. 1.1 cm cystic lesion at the tail of the pancreas. Recommend follow up pre and post contrast MRI/MRCP or pancreatic protocol CT in 2 years. This recommendation follows ACR consensus guidelines: Management of Incidental Pancreatic Cysts: A White Paper of the ACR Incidental Findings Committee. [place] 6840;33:[id number].**  Assessment/Plan: Intra Abdominal wall abscess= continue on ceftaz-avibactam plus tigecycline for MDRO isolated from 11/23 for CRE kleb pneumo and amp S VRE. Per Dr Cristal Generous note, it appears she will have further debridement today for last remain fluid collection  CRE kleb pneumo bacteremia= likely secondary to intra-abd wall process. Continue on cefta-avibactam. Awaiting sensitivity testing  cdifficile infection = hx of cdifficile, on repeat testing, toxin is negative by PCR positive. Decision to treat given diarrhea, and ongoing abtx exposure for IAI  [facility], [facility]ious Diseases Cell: [phone] Pager:  [phone]  04/09/2016, 11:26 AM

## 2016-04-09 NOTE — Care Management Note (Signed)
Case Management Note  Patient Details  Name: Janice Watson MRN: [id number] Date of Birth: 01/30/1932  Subjective/Objective:    Pt admitted with  Hyperkalemia and change in mental status        Action/Plan:   PTA from [facility] or SNF.  CM has placed call to [facility] resource to determine which part of facility pt resides in.  [place] [facility] of this [facility].  Pt has a son that is active in pts care.  CM will continue to follow for discharge needs   Expected Discharge Date:                  Expected Discharge Plan:  Long Term Acute Care (LTAC)  In-House Referral:     Discharge planning Services  CM Consult  Post Acute Care Choice:    Choice offered to:     DME Arranged:    DME Agency:     HH Arranged:    HH Agency:     Status of Service:  In process, will continue to follow  If discussed at Long Length of Stay Meetings, dates discussed:    Additional Comments: 04/09/2016  CM spoke again with Dr Benjiman CoreSvlani - CM informed attending that [facility] both surgery and I&D are services available in facility.  Per attending [facility] - CM will discuss with physician advisor.  04/06/16 Per attending; Anticipated discharge in approximately 2-3 day(s). [facility]. With new evidence for resistant Klebsiella bacteremia and acute bleed, she will need further treatment and monitoring before she is stable for d/c.    04/03/16 CM contacted by Dr Benjiman CoreSvlani - CM explained [facility] and restrictions with SNF due to medicare days.  Dr acknowledged and discussed it with attending - service is not comfortable at this time with discharging pt due to they are actively treating pt and have been unable to obtain medical records from [facility] and prior facility.  CM contacted [facility] liaison to facilitate record transfer.  CM spoke with  son [name] and shared LTACH recommendation and explained that [facility] can not offer pt bed at this time.  Son  is in agreement with [facility] when medically [facility].    CM has text paged attending service multiple times - CM text paged Dr Oswaldo DoneVincent and received call back - Dr Oswaldo DoneVincent will relay message to attending group.  CSW made aware of pt being from [facility] with tentative referral  CM confirmed that pt is from [facility] SNF - CM spoke with physician advisor and pt is actually appropriate for LTACH. Referral made to both agencies - [facility] informed CM that agency can not offer pt a bed at this time - [facility] is able to offer pt bed on LTACH side.  CM contacted family to discuss LTACH as a discharge option, and also contacted attending to inquire about discharge readiness. [name], Kashaun Bebo S, RN 04/09/2016, 12:16 PM

## 2016-04-09 NOTE — Procedures (Signed)
Incision and Drainage Procedure Note  Pre-operative Diagnosis: abdominal wall abscess  Post-operative Diagnosis: same  Indications: this is [age] yof who had abdominal wall abscess drained recently near a g tube. She has another area that has shown up next to this with increasing white count.  We discussed I and d of this site.   Anesthesia: 1% plain lidocaine  Procedure Details  The procedure, risks and complications have been discussed in detail (including, but not limited to airway compromise, infection, bleeding) with the patient, and the patient has signed consent to the procedure.  The skin was sterilely prepped and draped over the affected area in the usual fashion. After adequate local anesthesia, I&D with a #11 blade was performed on the left abdominal wall. Purulent drainage: present with large amount of old clot present. Dressings were replaced The patient was observed until stable.  Findings: Purulence and large abdominal wall hematoma   Drains: none  Condition: Tolerated procedure well   Complications: none.  Will check fluoro to make sure gastrostomy functioning fine

## 2016-04-09 NOTE — Progress Notes (Signed)
Patient transferred to 4N, receiving RN at bedside, no questions from RN, VS stable.  Hermina BartersBOWMAN, Haydn Hutsell M, RN

## 2016-04-09 NOTE — Progress Notes (Signed)
Subjective:  Patient states that she is doing well. She has no complaints today. She denies dizziness, shakiness, shortness of breath or weakness.    Objective:  Vital signs in last 24 hours: Vitals:   04/09/16 1500 04/09/16 1516 04/09/16 1600 04/09/16 1655  BP: 91/62  (!) 76/58 (!) 78/59  Pulse: 70  77 88  Resp: 18  18 (!) 21  Temp:      TempSrc:      SpO2: 100% 100% 100% 100%  Weight:      Height:       Constitutional: NAD, VS reviewed CV: RRR, no murmurs, rubs or gallops appreciated, no LE edema Resp: trach in place with sputum and secretions present; mechanical breath sounds present bilaterally, with rhonchi, no wheezing Abd: G tube in place, soft, +BS, bandage in place over 1st I&D site with adjacent fluctuant and extremely tender area.  GU: Foley cath in place, rectal tube in place Neuro: Alert; neuro grossly intact; asking appropriate questions  Assessment/Plan:  Principal Problem:   Hyperkalemia Active Problems:   Anemia   Pressure injury of skin   Ventilator associated pneumonia (HCC)   Hyperglycemia   Hyponatremia   Ventilator dependence (HCC)   UTI due to Klebsiella species   Recurrent colitis due to Clostridium difficile   Abdominal wall cellulitis   PICC (peripherally inserted central catheter) in place   Infection due to carbapenemase producing organism   Pneumonia of right lower lobe due to Klebsiella pneumoniae (HCC)   Abnormal amount of sputum   HCAP (healthcare-associated pneumonia)   VRE (vancomycin-resistant Enterococci) infection   Septicemia due to Klebsiella pneumoniae (HCC)   Abscess of abdominal wall   Idiopathic hypotension   Acute respiratory failure with hypoxia (HCC)  Carbapenem resistant K. pneumoniae bacteriemia:  Patient remains afebrile; PICC line is out. Repeat blood cultures since PICC removal and antibiotic broadening are NGTD x2 days.  --continue Avycaz (dosing per pharm) --f/u repeat BCx from 11/25 --asymptomatic  hypotension in last 24 hours that transiently improved with fluid boluses and PRBC administration. MAPs have remained >60, she is asymptomatic and has adequate urine output.   Abdominal wall cellulitis and abscess:  S/p I&D x2 per surgery as second site of abscess has occurred adjacent to first - purulent fluid and large amount of clotted blood. Cultures growing K pneumoniae - sensititive to ESBL and enterococcus sensitive to linezolid and ampicillin  --ceftazidime/avibactam; ampicillin changed to tygacil per ID to decrease risk of seizure --continue monitoring, surgery following  Complicated UTI: UCx from facility showed K pneumoniae growth; repeat cultures here have not had good results as too many organisms growing. --ceftazidime/avibactam per pharmacy, which is treating the kleb. bacteremia  Acute on Chronic anemia: Patient s/p 1U pRBC's 11/26, with stable hemoglobin post transfusion and this morning. I&D of second site of abdominal abscess revealed large abdominal wall hematoma which is likely the cause of these recurrent drops in her hemoglobin. Will continue monitoring for further bleeding. --continue to monitor for signs and symptoms of bleeding --f/u AM CBC  Ventilator Dependent:  Respiratory therapy doing pressure support trials. Pulmonary following - appreciate their assistance --duonebs q6hrs  G-tube dependent:  --continue tube feeds --fluoroscopy per surgery to evaluate function  C difficile colonization:   C dif testing showed +antigen, - toxin, and +PCR; colonization vs infection as patient was already on antibiotic coverage during time of testing. --vanc per G tube.   T2DM: Patient has had a couple of hypoglycemic episodes despite decreasing lantus and  continuing tube feeds. Fluoroscopy today to assess function of G tube. IVF D5NS. --decrease Lantus 5U qhs -- SSI - s  Chronically ill state: Patient with long term trach-G tube and multiple serious, active issues. She  had been on decadron 4mg  daily (we are presuming for adrenal insufficiency). ACTH stim test was negative so we have been tapering it (currently at 2mg  daily). We are initiating physiology cortisol replacement. --hydrocortisone 15mg  AM, 5mg  PM --Pepcid per G tube for stress ulcer prophylaxis.  FEN: tube feeds per G tube per nutrition, free water 100ml per G tube CODE: DNR DVT PPx: lovenox  Dispo: Anticipated discharge in approximately 3-5 day(s).   Janice MarketGorica Sidnie Swalley, MD 04/09/2016, 5:07 PM Pager [phone]

## 2016-04-09 NOTE — Progress Notes (Signed)
Hypoglycemic Event  CBG: 35  Treatment: 4 oz of apple juice, scheduled prostat  Symptoms: None  Follow-up CBG: Time:1044 CBG Result:81  Possible Reasons for Event: Medication regimen: lantus  Comments/MD notified:team rounding, aware of CBG trend, lantus changed to 5 units     Janice Watson

## 2016-04-09 NOTE — Progress Notes (Signed)
Medicine attending: I personally examined this patient today and reviewed all pertinent clinical and laboratory data and I concur with the evaluation and management plan which will be outlined in the progress note dictated by resident physician Dr.Gorica Svalina which we discussed together. Situation remains complex but I believe we are headed in the right direction. Cultures from blood, urine, and abdominal wall abscess growing resistant Klebsiella. Infectious disease now involved. She is back on enteral vancomycin to cover recurrent resistant C. difficile. She is now receiving Tygacil and Avycaz for the resistant Klebsiella. She has another area of abscess formation on the abdominal wall adjacent to the recent area which was incised and drained. An additional I and D procedure is planned for today. She remains afebrile. Fluctuating white blood cell count between 10 and 15,000. Platelet count now normal. Blood pressures are running low but she is alert, oriented, has good urine output. She required fluid challenges over the night. Despite negative ACTH stimulation test, we are still concerned that she may be adrenally insufficient. We are tapering her off Decadron but will initiate physiologic cortisol replacement. Ventilator status is stable. We appreciate ongoing assistance from pulmonary/critical care medicine. She still has a rectal tube in place for persistent diarrhea.  This lady has multiple, complex, medical issues and I do not think it is appropriate for her to be sent back to the long-term care facility at this time. Considering that the main reason she was sent here in the first place was because the LTAC felt uncomfortable managing an elevated potassium and a urinary tract infection, I doubt they would be comfortable handling the complex issues outlined above.

## 2016-04-09 NOTE — Progress Notes (Signed)
[facility] to [phone]. TF at 35 cc/hr Pt alert orientation is in question - denies pain at this time

## 2016-04-09 NOTE — Progress Notes (Signed)
PT Cancellation Note  Patient Details Name: Janice Watson MRN: [id number] DOB: 19-Dec-1931   Cancelled Treatment:    Reason Eval/Treat Not Completed: Patient not medically ready. RN deferred this date due to pt with low BPs 76/61. PT to return as able.   Donivin Wirt M Lopaka Karge 04/09/2016, 8:00 AM   Lewis ShockAshly Raymie Giammarco, PT, DPT Pager #: [phone] Office #: [phone]

## 2016-04-09 NOTE — Progress Notes (Addendum)
Hypoglycemic Event  CBG: 68  Treatment: 4 oz of juice given  Symptoms: None  Follow-up CBG: Time: 1243 CBG Result: 77  Possible Reasons for Event: Medication regimen: lantus  Comments/MD notified:MD made aware    Kyle Stansell M

## 2016-04-09 NOTE — Consult Note (Signed)
PULMONARY / CRITICAL CARE MEDICINE   Name: Janice Watson MRN: [id number] DOB: July 15, 1931    ADMISSION DATE:  03/31/2016 CONSULTATION DATE:  11/21  REFERRING MD:  TS  CHIEF COMPLAINT:  ?pneumonia  HISTORY OF PRESENT ILLNESS:   82 yo [facility] ([facility]) resident who is trach/vent dependent, DNR, and was admitted from [facility] ED with CC of pneumonia and hyperkalemia with preserved renal function. She has a PICC and G tube in place. Currently stable, VSS, and is awake and alert on full vent support. Being treated for pneumonia with Vanc/maxipime. She has recent hx of being trach dependent prior to transfer to [facility] from [facility] in [place]. PCCM asked to help with vent.   SUBJECTIVE:  Pt denies acute complaints.  Reports she has not used a PMV.  Previously was on ATC during daytime hours.    VITAL SIGNS: BP (!) 80/57   Pulse 63   Temp 97.3 F (36.3 C) (Oral)   Resp 15   Ht 5\' 1"  (1.549 m) Comment: per patient  Wt 133 lb 2.5 oz (60.4 kg)   SpO2 100%   BMI 25.16 kg/m   HEMODYNAMICS:    VENTILATOR SETTINGS: Vent Mode: PRVC FiO2 (%):  [40 %] 40 % Set Rate:  [16 bmp] 16 bmp Vt Set:  [400 mL] 400 mL PEEP:  [5 cmH20] 5 cmH20 Pressure Support:  [12 cmH20] 12 cmH20 Plateau Pressure:  [18 cmH20] 18 cmH20  INTAKE / OUTPUT: I/O last 3 completed shifts: In: 3464 [I.V.:40; Blood:629; Other:970; WU/JW:1191G/GT:1425; IV Piggyback:400] Out: 2135 [Urine:1635; Drains:200; Stool:300]  PHYSICAL EXAMINATION: General:  Frail elderly female in NAD on vent Neuro:  Communicates non verbally, appears appropriate  HEENT: Trach in place, mm pink/moist Cardiovascular:  HSIR Lungs: even/non-labored, bsx4 active  Abdomen:  Mild distention, erythema on abdominal wall, L surgical site dressing intact, tender to touch Musculoskeletal:  No acute deformities  Skin: warm and dry  LABS:  BMET  Recent Labs Lab 04/07/16 0719 04/08/16 0305 04/09/16 0306  NA 134* 135 138  K 4.6 4.3 4.2  CL 111 110 112*   CO2 18* 20* 21*  BUN 32* 30* 30*  CREATININE 0.60 0.50 0.45  GLUCOSE 128* 138* 89    Electrolytes  Recent Labs Lab 04/07/16 0719 04/08/16 0305 04/09/16 0306  CALCIUM 7.7* 7.6* 7.2*    CBC  Recent Labs Lab 04/08/16 1935 04/09/16 0306 04/09/16 0930  WBC 10.0 9.7 15.2*  HGB 8.7* 8.6* 10.0*  HCT 26.9* 26.2* 31.0*  PLT 100* 118* 151    Coag's No results for input(s): APTT, INR in the last 168 hours.  Sepsis Markers  Recent Labs Lab 04/03/16 0420 04/04/16 2232 04/05/16 0523  LATICACIDVEN  --  1.0  --   PROCALCITON 0.33  --  0.30    ABG No results for input(s): PHART, PCO2ART, PO2ART in the last 168 hours.  Liver Enzymes No results for input(s): AST, ALT, ALKPHOS, BILITOT, ALBUMIN in the last 168 hours.  Cardiac Enzymes No results for input(s): TROPONINI, PROBNP in the last 168 hours.  Glucose  Recent Labs Lab 04/08/16 2019 04/08/16 2321 04/09/16 0330 04/09/16 0838 04/09/16 1044 04/09/16 1156  GLUCAP 138* 99 78 35* 81 68    Imaging No results found.   STUDIES:    CULTURES: 11/20 uc >> multiple species 11/20 c diff >> ++ 11/19 bc >> klebsiella pneumoniae, CRE 11/19 sputum >> mul sp 11/22 Aerobic G-Tube Culture >> VRE S- linezolid  Klebsiella pneumoniae S-extended ESBL negative, MDR  ANTIBIOTICS: 11/19 vanc >> 11/19 cefepime >>  SIGNIFICANT EVENTS: 11/18 tx to [facility]  LINES/TUBES: Rt picc >> #8 shiley XLT >> G tube >>  DISCUSSION: 82 yo [facility] ) resident who is trach/vent dependent, DNR, and was [facility] ED with CC of pneumonia and hyperkalemia with preserved renal function. She has a PICC and G tube in place. Currently stable, VSS, and is awake and alert on full vent support. Being treated for pneumonia with Vanc/maxipime. She has recent hx of being trach dependent prior to transfer to [facility] from [facility] in [place]. PCCM asked to help with vent.  ASSESSMENT / PLAN:  PULMONARY A: Tracheostomy /  Ventilator Dependent Respiratory Failure  # 8 shiley xlt P:   PRVC 8 cc/kg  Wean to ATC during daytime as tolerated SLP evaluation for PMV use as able  Intermittent CXR   CARDIOVASCULAR A:  Hx of Takotsubo CM Hx of HTN P:  Monitor hemodynamics  DNR in the event of arrest   RENAL A:   No acute issues currently P:   Trend BMP / UOP  Replace electrolytes as indicated   GASTROINTESTINAL A:   Abdominal Wall Abscess  GI protection At Risk Malnutrition  P:   Pepcid for SUP in setting of C-Diff  See ID   HEMATOLOGIC A:   Anemia P:  Transfuse per protocol  INFECTIOUS A:   Abdominal Wall Abscess - s/p I&D  R/O PNA - no WBC or fever  C-Diff Positive  Klebsiella (CRE) Bacteremia  VRE - in wound culture  P:   ABX per ID, appreciate input   Monitor fever curve / WBC Follow cultures   ENDOCRINE A:   DM Chronic steroid use with decadron  P:   SSI per TS Continue cortef   NEUROLOGIC A:   No acute issue, follows commands, non verbal P:   RASS goal: 0 Minimum sedation    FAMILY  - Updates:  Patient updated on plan of care.    - Inter-disciplinary family meet or Palliative Care meeting due by:  11/28  CC Time:  30 minutes   Canary BrimBrandi Ollis, NP-C [facility] Pgr: [phone] or if no answer [phone] 04/09/2016, 1:34 PM   Attending Note:  I have examined patient, reviewed labs, studies and notes. I have discussed the case with B Ollis, and I agree with the data and plans as amended above. 82 yo woman, chronic resp failure, has required nocturnal MV for last 6 months, admitted with HCAP and abdominal wall abscess s/p I&D. She required continuous MV for initial support, has now been able to tolerate ATC during the day - her prior baseline. She has not done any PMV thus far. Agree with continuing nocturnal vent, unclear that she will be able to progress to complete vent freedom. Would have SLP assess her for cuff down. Independent critical care  time is 31 minutes.   Levy Pupaobert Anjeanette Petzold, MD, PhD 04/09/2016, 8:03 PM [facility] [phone] or if no answer [phone]

## 2016-04-09 NOTE — Progress Notes (Addendum)
Nutrition Follow-up  DOCUMENTATION CODES:   Not applicable  INTERVENTION:  Continue Glucerna 1.2 @ 35 ml/hr via PEG with 30 ml Prostat BID.    Tube feeding regimen provides 1208 kcal, 80 grams of protein, and 680 ml of H2O.   Continue free water flushes of 100 ml q 8 hours.   RD to continue to monitor.   NUTRITION DIAGNOSIS:   Inadequate oral intake related to inability to eat as evidenced by NPO status; ongoing  GOAL:   Patient will meet greater than or equal to 90% of their needs; met  MONITOR:   TF tolerance, Vent status, Weight trends, I & O's, Skin, Labs  REASON FOR ASSESSMENT:   Ventilator    ASSESSMENT:   82 yo F ventilator dependent with a trach, pmhx of HTN, COPD, and GERD who presents from [facility] for reported hyperkalemia. Patient is non verbal and only able to answer yes/no questions with a nod. She is alert to person, place, and time with yes/no questioning Pt C. Diff positive. Pt with Klebsiella Bacteremia. Abdominal Wall Abscess - s/p I&D 11/23.   Patient is currently intubated on ventilator support MV: 7.6 L/min Temp (24hrs), Avg:97.9 F (36.6 C), Min:97.3 F (36.3 C), Max:98.3 F (36.8 C)  Propofol: none   Pt awake on vent. Pt has been tolerating her tube feeds. RD to continue with current orders. Will continue to monitor.   Labs and medications reviewed.   Diet Order:    NPO  Skin:  Wound (see comment) (Stg 2 ulcer buttocks; non healing mid-abdominal wound)  Last BM:  11/27  Height:   Ht Readings from Last 1 Encounters:  04/02/16 5' 1"  (1.549 m)    Weight:   Wt Readings from Last 1 Encounters:  04/08/16 133 lb 2.5 oz (60.4 kg)  04/02/16 117 lbs (53.5) Pt +7.2 L since admission (11/18)  Ideal Body Weight:  47.7 kg  BMI:  Body mass index is 25.16 kg/m.  Estimated Nutritional Needs:   Kcal:  1070  Protein:  80-90 grams  Fluid:  >/= 1.5 L/day  EDUCATION NEEDS:   No education needs identified at this  time  Corrin Parker, MS, RD, LDN Pager # [phone] After hours/ weekend pager # [phone]

## 2016-04-09 NOTE — Progress Notes (Signed)
Subjective: Awake on vent  Objective: Vital signs in last 24 hours: Temp:  [97.6 F (36.4 C)-98.3 F (36.8 C)] 97.6 F (36.4 C) (11/27 0400) Pulse Rate:  [66-99] 70 (11/27 0713) Resp:  [13-28] 15 (11/27 0713) BP: (65-119)/(28-76) 73/64 (11/27 0713) SpO2:  [99 %-100 %] 100 % (11/27 0713) FiO2 (%):  [40 %] 40 % (11/27 0713) Last BM Date: 04/09/16  Intake/Output from previous day: 11/26 0701 - 11/27 0700 In: 2459 [I.V.:40; Blood:629; NG/GT:840; IV Piggyback:200] Out: 1735 [Urine:1235; Drains:200; Stool:300] Intake/Output this shift: No intake/output data recorded.  GI: erythema around drainage site, no bleeding now, repacked, there is another area superior and lateral to this that appears fluctuant  Lab Results:   Recent Labs  04/08/16 1935 04/09/16 0306  WBC 10.0 9.7  HGB 8.7* 8.6*  HCT 26.9* 26.2*  PLT 100* 118*   BMET  Recent Labs  04/08/16 0305 04/09/16 0306  NA 135 138  K 4.3 4.2  CL 110 112*  CO2 20* 21*  GLUCOSE 138* 89  BUN 30* 30*  CREATININE 0.50 0.45  CALCIUM 7.6* 7.2*   PT/INR No results for input(s): LABPROT, INR in the last 72 hours. ABG No results for input(s): PHART, HCO3 in the last 72 hours.  Invalid input(s): PCO2, PO2  Studies/Results: No results found.  Anti-infectives: Anti-infectives    Start     Dose/Rate Route Frequency Ordered Stop   04/09/16 0200  tigecycline (TYGACIL) 50 mg in sodium chloride 0.9 % 100 mL IVPB     50 mg 200 mL/hr over 30 Minutes Intravenous Every 12 hours 04/08/16 1328     04/08/16 1400  tigecycline (TYGACIL) 100 mg in sodium chloride 0.9 % 100 mL IVPB     100 mg 200 mL/hr over 30 Minutes Intravenous  Once 04/08/16 1328 04/08/16 1805   04/07/16 1300  ampicillin (OMNIPEN) 2 g in sodium chloride 0.9 % 50 mL IVPB  Status:  Discontinued     2 g 150 mL/hr over 20 Minutes Intravenous Every 6 hours 04/07/16 1201 04/08/16 1327   04/06/16 2100  vancomycin (VANCOCIN) 50 mg/mL oral solution 125 mg     125 mg  Per Tube Every 6 hours 04/06/16 2031     04/06/16 2100  vancomycin (VANCOCIN) IVPB 1000 mg/200 mL premix  Status:  Discontinued     1,000 mg 200 mL/hr over 60 Minutes Intravenous Every 24 hours 04/06/16 2035 04/07/16 1118   04/06/16 0930  ceftazidime-avibactam (AVYCAZ) 1.25 g in dextrose 5 % 50 mL IVPB     1.25 g 25 mL/hr over 2 Hours Intravenous Every 8 hours 04/06/16 0920     04/05/16 1100  vancomycin (VANCOCIN) IVPB 1000 mg/200 mL premix  Status:  Discontinued     1,000 mg 200 mL/hr over 60 Minutes Intravenous Every 24 hours 04/05/16 1015 04/06/16 0920   04/05/16 1000  ceFEPIme (MAXIPIME) 1 g in dextrose 5 % 50 mL IVPB  Status:  Discontinued     1 g 100 mL/hr over 30 Minutes Intravenous Every 24 hours 04/04/16 1123 04/04/16 1152   04/05/16 0500  piperacillin-tazobactam (ZOSYN) IVPB 3.375 g  Status:  Discontinued     3.375 g 12.5 mL/hr over 240 Minutes Intravenous Every 8 hours 04/04/16 2200 04/06/16 0920   04/04/16 2230  piperacillin-tazobactam (ZOSYN) IVPB 3.375 g     3.375 g 100 mL/hr over 30 Minutes Intravenous  Once 04/04/16 2200 04/04/16 2300   04/04/16 2200  clindamycin (CLEOCIN) IVPB 600 mg  Status:  Discontinued  600 mg 100 mL/hr over 30 Minutes Intravenous Every 8 hours 04/04/16 2153 04/04/16 2211   04/04/16 1400  ceFAZolin (ANCEF) IVPB 1 g/50 mL premix  Status:  Discontinued     1 g 100 mL/hr over 30 Minutes Intravenous Every 8 hours 04/04/16 1152 04/04/16 2152   04/04/16 1400  vancomycin (VANCOCIN) IVPB 750 mg/150 ml premix  Status:  Discontinued     750 mg 150 mL/hr over 60 Minutes Intravenous Every 24 hours 04/04/16 1252 04/05/16 1015   04/01/16 1200  vancomycin (VANCOCIN) 50 mg/mL oral solution 125 mg  Status:  Discontinued     125 mg Oral Every 6 hours 04/01/16 1040 04/04/16 1147   04/01/16 1200  ceFEPIme (MAXIPIME) 1 g in dextrose 5 % 50 mL IVPB  Status:  Discontinued     1 g 100 mL/hr over 30 Minutes Intravenous Every 12 hours 04/01/16 1130 04/04/16 1123    04/01/16 1000  fluconazole (DIFLUCAN) tablet 200 mg  Status:  Discontinued     200 mg Per Tube Daily 04/01/16 0322 04/02/16 1246   04/01/16 0800  piperacillin-tazobactam (ZOSYN) IVPB 3.375 g  Status:  Discontinued     3.375 g 12.5 mL/hr over 240 Minutes Intravenous Every 8 hours 04/01/16 0200 04/01/16 0729   03/31/16 2330  piperacillin-tazobactam (ZOSYN) IVPB 3.375 g     3.375 g 100 mL/hr over 30 Minutes Intravenous  Once 03/31/16 2320 04/01/16 0220      Assessment/Plan: POD 4 abdominal wall abscess I and d  Better overall it appears, some bleeding from wound,will recheck hct now Has another area that needs to be drained and will do later today Cont abx  [facility],Jaidev Sanger 04/09/2016

## 2016-04-10 ENCOUNTER — Inpatient Hospital Stay (HOSPITAL_COMMUNITY): Payer: Medicare Other

## 2016-04-10 ENCOUNTER — Encounter (HOSPITAL_COMMUNITY): Payer: Self-pay | Admitting: Interventional Radiology

## 2016-04-10 DIAGNOSIS — K9420 Gastrostomy complication, unspecified: Secondary | ICD-10-CM

## 2016-04-10 HISTORY — PX: IR GENERIC HISTORICAL: IMG1180011

## 2016-04-10 LAB — GLUCOSE, CAPILLARY
GLUCOSE-CAPILLARY: 138 mg/dL — AB (ref 65–99)
GLUCOSE-CAPILLARY: 177 mg/dL — AB (ref 65–99)
GLUCOSE-CAPILLARY: 76 mg/dL (ref 65–99)
GLUCOSE-CAPILLARY: 90 mg/dL (ref 65–99)
Glucose-Capillary: 152 mg/dL — ABNORMAL HIGH (ref 65–99)
Glucose-Capillary: 133 mg/dL — ABNORMAL HIGH (ref 65–99)

## 2016-04-10 LAB — CBC
HCT: 27.4 % — ABNORMAL LOW (ref 36.0–46.0)
Hemoglobin: 8.9 g/dL — ABNORMAL LOW (ref 12.0–15.0)
MCH: 30.5 pg (ref 26.0–34.0)
MCHC: 32.5 g/dL (ref 30.0–36.0)
MCV: 93.8 fL (ref 78.0–100.0)
PLATELETS: 127 10*3/uL — AB (ref 150–400)
RBC: 2.92 MIL/uL — AB (ref 3.87–5.11)
RDW: 17.8 % — AB (ref 11.5–15.5)
WBC: 9.6 10*3/uL (ref 4.0–10.5)

## 2016-04-10 LAB — BASIC METABOLIC PANEL
Anion gap: 4 — ABNORMAL LOW (ref 5–15)
BUN: 33 mg/dL — AB (ref 6–20)
CALCIUM: 7.2 mg/dL — AB (ref 8.9–10.3)
CO2: 23 mmol/L (ref 22–32)
CREATININE: 0.4 mg/dL — AB (ref 0.44–1.00)
Chloride: 111 mmol/L (ref 101–111)
GFR calc non Af Amer: >60 mL/min (ref >60)
Glucose, Bld: 165 mg/dL — ABNORMAL HIGH (ref 65–99)
Potassium: 4 mmol/L (ref 3.5–5.1)
Sodium: 138 mmol/L (ref 135–145)

## 2016-04-10 LAB — HEPATIC FUNCTION PANEL
ALT: 21 U/L (ref 14–54)
AST: 25 U/L (ref 15–41)
Albumin: 1.4 g/dL — ABNORMAL LOW (ref 3.5–5.0)
Alkaline Phosphatase: 88 U/L (ref 38–126)
Total Bilirubin: 0.1 mg/dL — ABNORMAL LOW (ref 0.3–1.2)
Total Protein: 3.9 g/dL — ABNORMAL LOW (ref 6.5–8.1)

## 2016-04-10 MED ORDER — IOPAMIDOL (ISOVUE-300) INJECTION 61%
INTRAVENOUS | Status: AC
  Filled 2016-04-10: qty 50

## 2016-04-10 MED ORDER — INSULIN GLARGINE 100 UNIT/ML ~~LOC~~ SOLN
3.0000 [IU] | Freq: Every day | SUBCUTANEOUS | Status: DC
  Administered 2016-04-11: 3 [IU] via SUBCUTANEOUS
  Filled 2016-04-10 (×2): qty 0.03

## 2016-04-10 MED ORDER — IOPAMIDOL (ISOVUE-300) INJECTION 61%
INTRAVENOUS | Status: AC
  Administered 2016-04-10: 50 mL
  Filled 2016-04-10: qty 50

## 2016-04-10 MED ORDER — SODIUM CHLORIDE 0.9 % IV BOLUS (SEPSIS)
500.0000 mL | Freq: Once | INTRAVENOUS | Status: AC
  Administered 2016-04-10: 500 mL via INTRAVENOUS

## 2016-04-10 MED ORDER — ONDANSETRON HCL 4 MG/2ML IJ SOLN
4.0000 mg | Freq: Once | INTRAMUSCULAR | Status: AC
  Administered 2016-04-10: 4 mg via INTRAVENOUS
  Filled 2016-04-10: qty 2

## 2016-04-10 MED ORDER — DEXAMETHASONE 0.5 MG PO TABS
1.0000 mg | ORAL_TABLET | Freq: Every day | ORAL | Status: DC
  Administered 2016-04-11: 1 mg
  Filled 2016-04-10: qty 2

## 2016-04-10 NOTE — Care Management (Signed)
CM contacted by Dr Samuella CotaSvalina - pt may possibly be ready for discharge late tomorrow.  Attending verified that [facility] can provide current IV antibiotics.  Pt has transferred to 4E - new unit CM made aware.

## 2016-04-10 NOTE — Progress Notes (Signed)
Patient vomited approximately 800cc of tan colored emesis. No evidence of aspiration at this time. Tube feed on hold. Zofran 4mg  given.   Upon cleaning the patient, this RN performed wound care to abdomen per order. I&D sites were red and inflamed. PEG site was swollen, severely tender and warm to touch.   PEG tube clogged. Unable to flush. Unable to give vancomycin oral solution per tube.   Earlene PlaterWallace, MD notified about stated above.

## 2016-04-10 NOTE — Progress Notes (Signed)
Medicine attending: I examined this patient today and I concur with the evaluation and management plan as recorded by resident physician Dr.Gorica Svalina. The patient had a second incision and drainage procedure yesterday for an abdominal wall abscess. Additional large amount of purulent material removed. White blood count trending down. She remains afebrile. Now on antibiotics to cover both vancomycin resistant enterococci and beta-lactam resistant Klebsiella in addition to enteral vancomycin for recurrent C. difficile colitis. Rectal tube still in place. Still with intermittent low blood pressures but trend for stabilization. Urine output decreased yesterday compared with prior days. Additional fluids being administered. Empiric physiologic cortisol started. Blood sugars more stable. We will discuss with infectious disease service optimal duration of antibiotic therapy. We will also check to see if the long-term care facility can obtain and administer the current fourth-generation antibiotics.

## 2016-04-10 NOTE — Progress Notes (Signed)
[facility] for Infectious Disease    Date of Admission:  03/31/2016   Total days of antibiotics 11        Day 5 ceftaz-avibactam        Day 3 tigecycline        Day 9 oral vanco   ID: Janice Watson is a 82 y.o. female with  with  VDRF, COPD, abdominal abscess evidence for VAP, and possibly Clostridium difficile, now found to have K PC klebsiella pneumonia bacteremia, KPC Klebsiella s/p I x D Principal Problem:   Hyperkalemia Active Problems:   Anemia   Pressure injury of skin   Ventilator associated pneumonia (HCC)   Hyperglycemia   Hyponatremia   Ventilator dependence ([place])   UTI due to Klebsiella species   Recurrent colitis due to Clostridium difficile   Abdominal wall cellulitis   PICC (peripherally inserted central catheter) in place   Infection due to carbapenemase producing organism   Pneumonia of right lower lobe due to Klebsiella pneumoniae ([place])   Abnormal amount of sputum   HCAP (healthcare-associated pneumonia)   VRE (vancomycin-resistant Enterococci) infection   Septicemia due to Klebsiella pneumoniae ([place])   Abscess of abdominal wall   Idiopathic hypotension   Acute respiratory failure with hypoxia ([place])   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 Patient is feeling better today.Abdominal wall looks less erythamatous since last debridement yesterday  Medications:  . busPIRone  10 mg Per Tube TID  . ceftazidime avibactam (AVYCAZ) IVPB  1.25 g Intravenous Q8H  . chlorhexidine gluconate (MEDLINE KIT)  15 mL Mouth Rinse BID  . [START ON 04/11/2016] dexamethasone  1 mg Per Tube Daily  . enoxaparin (LOVENOX) injection  40 mg Subcutaneous Q24H  . famotidine  20 mg Per Tube Daily  . feeding supplement (PRO-STAT SUGAR FREE 64)  30 mL Per Tube BID  . free water  100 mL Per Tube Q8H  . Gerhardt's butt cream   Topical QID  . hydrocortisone  15 mg Oral Q24H   And  . hydrocortisone  5 mg Oral Q24H  . insulin aspart   0-9 Units Subcutaneous Q4H  . insulin glargine  5 Units Subcutaneous QHS  . iopamidol      . ipratropium-albuterol  3 mL Nebulization TID  . lidocaine-EPINEPHrine  20 mL Intradermal Once  . mouth rinse  15 mL Mouth Rinse QID  . mirtazapine  15 mg Per Tube QHS  . multivitamin  15 mL Per Tube Daily  . sodium chloride flush  3 mL Intravenous Q12H  . tigecycline (TYGACIL) IVPB  50 mg Intravenous Q12H  . traMADol  25 mg Per Tube BID  . vancomycin  125 mg Per Tube Q6H    Objective: Vital signs in last 24 hours: Temp:  [97.8 F (36.6 C)-98.6 F (37 C)] 98.6 F (37 C) (11/28 0817) Pulse Rate:  [70-102] 83 (11/28 1129) Resp:  [15-23] 22 (11/28 1129) BP: (76-124)/(21-109) 117/56 (11/28 1129) SpO2:  [100 %] 100 % (11/28 1318) FiO2 (%):  [40 %] 40 % (11/28 1318) Weight:  [143 lb 4.8 oz (65 kg)] 143 lb 4.8 oz (65 kg) (11/27 1739)  Physical Exam  Constitutional:  oriented to person, place, and time. appears well-developed and her appropriate age. No distress.  HENT: [place]/AT, PERRLA, no scleral icterus Mouth/Throat: Oropharynx is clear and moist. No oropharyngeal exudate.  Neck: trach in place Cardiovascular: Normal rate, regular rhythm and normal heart sounds. Exam reveals no gallop  and no friction rub.  No murmur heard.  Pulmonary/Chest: Effort normal and breath sounds normal. No respiratory distress.  has no wheezes.  Abdominal: Soft. Bowel sounds are decreased. Bandaged abdominal wall incision, less erythema. Peg does not appear to have any leaks. has flexiseal in palce Lymphadenopathy: no cervical adenopathy. No axillary adenopathy Neurological: alert and oriented to person, place, and time.  Skin: Skin is warm and dry. No rash noted. No erythema.  Psychiatric: a normal mood and affect.  behavior is normal.    Lab Results  Recent Labs  04/09/16 0306 04/09/16 0930 04/10/16 0253  WBC 9.7 15.2* 9.6  HGB 8.6* 10.0* 8.9*  HCT 26.2* 31.0* 27.4*  NA 138  --  138  K 4.2  --  4.0    CL 112*  --  111  CO2 21*  --  23  BUN 30*  --  33*  CREATININE 0.45  --  0.40*    Microbiology: 11/25 blood cx ngtd 11/23 aerobic cx CRE Kleb pneumo and VRE 11/22 aerobic cx CRE kleb pneumo and VRE 11/19 blood cx CRE kleb pneumo Studies/Results: Diffuse soft tissue air tracking along the left anterior abdominal wall, extending from the left upper quadrant of the left lower quadrant, with a small amount of associated fluid noted in the soft tissues. Findings raise concern for infection with a gas producing organism and necrotizing fasciitis. 2. G-tube noted in expected position. Contrast noted in the stomach. Stomach unremarkable in appearance. 3. Dense consolidation of the left lower lobe, concerning for pneumonia. Rounded opacity at the right middle lobe may reflect a small infectious cavitation, raising concern for atypical infection. Mild right basilar atelectasis noted. 4. Contrast noted within the distal esophagus, reflecting gastroesophageal reflux. 5. Small bilateral renal cysts seen. 6. Scattered aortic atherosclerosis. 7. An incidental finding of potential clinical significance has been found. 1.1 cm cystic lesion at the tail of the pancreas. Recommend follow up pre and post contrast MRI/MRCP or pancreatic protocol CT in 2 years. This recommendation follows ACR consensus guidelines: Management of Incidental Pancreatic Cysts: A White Paper of the ACR Incidental Findings Committee. [place] 9741;63:[id number].**  Assessment/Plan: Intra Abdominal wall abscess= continue on ceftaz-avibactam plus tigecycline for MDRO isolated from 11/23 for CRE kleb pneumo and amp S VRE. Recommend to treat for 14 days, currently day 3 of 14, using combination of tigecycline plus ceftaz-avibactam  CRE kleb pneumo bacteremia= likely secondary to intra-abd wall process. Continue on cefta-avibactam. Awaiting sensitivity testing possibly back tomorrow  cdifficile infection = hx of  cdifficile, on repeat testing, toxin is negative by PCR positive. Decision to treat given diarrhea, and ongoing abtx exposure. Plan to treat through duration of IV abtx, with oral vanco per peg 143m qid x 11 more days  [facility] Cell: [phone]: [phone]  04/10/2016, 2:52 PM

## 2016-04-10 NOTE — Progress Notes (Signed)
Abdominal area close to GT is very tender to touch.  Dressing changed as ordered.  Vital signs has been good this shift.

## 2016-04-10 NOTE — Progress Notes (Signed)
Subjective: Awake on vent  Objective: Vital signs in last 24 hours: Temp:  [97.3 F (36.3 C)-98.6 F (37 C)] 98.6 F (37 C) (11/28 0817) Pulse Rate:  [63-102] 92 (11/28 0928) Resp:  [15-23] 17 (11/28 0729) BP: (76-124)/(21-109) 124/60 (11/28 0928) SpO2:  [98 %-100 %] 100 % (11/28 0729) FiO2 (%):  [40 %] 40 % (11/28 0928) Weight:  [65 kg (143 lb 4.8 oz)] 65 kg (143 lb 4.8 oz) (11/27 1739) Last BM Date:  (flexiseal)  Intake/Output from previous day: 11/27 0701 - 11/28 0700 In: 2957.5 [I.V.:1182.5; NG/GT:975; IV Piggyback:800] Out: 1320 [Urine:720; Stool:600] Intake/Output this shift: Total I/O In: 3 [I.V.:3] Out: -   GI: two open wounds left abdomen much less erythema, draining old blood, this is much better than yesterday, g tube site clean, midline wound healing slowly  Lab Results:   Recent Labs  04/09/16 0930 04/10/16 0253  WBC 15.2* 9.6  HGB 10.0* 8.9*  HCT 31.0* 27.4*  PLT 151 127*   BMET  Recent Labs  04/09/16 0306 04/10/16 0253  NA 138 138  K 4.2 4.0  CL 112* 111  CO2 21* 23  GLUCOSE 89 165*  BUN 30* 33*  CREATININE 0.45 0.40*  CALCIUM 7.2* 7.2*   PT/INR No results for input(s): LABPROT, INR in the last 72 hours. ABG No results for input(s): PHART, HCO3 in the last 72 hours.  Invalid input(s): PCO2, PO2  Studies/Results: Dg Chest Port 1 View  Result Date: 04/10/2016 CLINICAL DATA:  Acute respiratory failure with hypoxia. EXAM: PORTABLE CHEST 1 VIEW COMPARISON:  04/03/2016 FINDINGS: Tracheostomy tube and cardiomediastinal silhouette are unchanged allowing for differences in patient rotation. Lung volumes remain mildly diminished. Left greater than right lung opacities have not significantly changed and are most notable in the left base. No large pleural effusion or pneumothorax is identified. Old bilateral rib fractures are again seen. IMPRESSION: Unchanged left greater than right lung airspace opacities. Electronically Signed   By: Sebastian AcheAllen   Grady M.D.   On: 04/10/2016 07:40    Anti-infectives: Anti-infectives    Start     Dose/Rate Route Frequency Ordered Stop   04/09/16 0200  tigecycline (TYGACIL) 50 mg in sodium chloride 0.9 % 100 mL IVPB     50 mg 200 mL/hr over 30 Minutes Intravenous Every 12 hours 04/08/16 1328     04/08/16 1400  tigecycline (TYGACIL) 100 mg in sodium chloride 0.9 % 100 mL IVPB     100 mg 200 mL/hr over 30 Minutes Intravenous  Once 04/08/16 1328 04/08/16 1805   04/07/16 1300  ampicillin (OMNIPEN) 2 g in sodium chloride 0.9 % 50 mL IVPB  Status:  Discontinued     2 g 150 mL/hr over 20 Minutes Intravenous Every 6 hours 04/07/16 1201 04/08/16 1327   04/06/16 2100  vancomycin (VANCOCIN) 50 mg/mL oral solution 125 mg     125 mg Per Tube Every 6 hours 04/06/16 2031     04/06/16 2100  vancomycin (VANCOCIN) IVPB 1000 mg/200 mL premix  Status:  Discontinued     1,000 mg 200 mL/hr over 60 Minutes Intravenous Every 24 hours 04/06/16 2035 04/07/16 1118   04/06/16 0930  ceftazidime-avibactam (AVYCAZ) 1.25 g in dextrose 5 % 50 mL IVPB     1.25 g 25 mL/hr over 2 Hours Intravenous Every 8 hours 04/06/16 0920     04/05/16 1100  vancomycin (VANCOCIN) IVPB 1000 mg/200 mL premix  Status:  Discontinued     1,000 mg 200 mL/hr over 60  Minutes Intravenous Every 24 hours 04/05/16 1015 04/06/16 0920   04/05/16 1000  ceFEPIme (MAXIPIME) 1 g in dextrose 5 % 50 mL IVPB  Status:  Discontinued     1 g 100 mL/hr over 30 Minutes Intravenous Every 24 hours 04/04/16 1123 04/04/16 1152   04/05/16 0500  piperacillin-tazobactam (ZOSYN) IVPB 3.375 g  Status:  Discontinued     3.375 g 12.5 mL/hr over 240 Minutes Intravenous Every 8 hours 04/04/16 2200 04/06/16 0920   04/04/16 2230  piperacillin-tazobactam (ZOSYN) IVPB 3.375 g     3.375 g 100 mL/hr over 30 Minutes Intravenous  Once 04/04/16 2200 04/04/16 2300   04/04/16 2200  clindamycin (CLEOCIN) IVPB 600 mg  Status:  Discontinued     600 mg 100 mL/hr over 30 Minutes Intravenous  Every 8 hours 04/04/16 2153 04/04/16 2211   04/04/16 1400  ceFAZolin (ANCEF) IVPB 1 g/50 mL premix  Status:  Discontinued     1 g 100 mL/hr over 30 Minutes Intravenous Every 8 hours 04/04/16 1152 04/04/16 2152   04/04/16 1400  vancomycin (VANCOCIN) IVPB 750 mg/150 ml premix  Status:  Discontinued     750 mg 150 mL/hr over 60 Minutes Intravenous Every 24 hours 04/04/16 1252 04/05/16 1015   04/01/16 1200  vancomycin (VANCOCIN) 50 mg/mL oral solution 125 mg  Status:  Discontinued     125 mg Oral Every 6 hours 04/01/16 1040 04/04/16 1147   04/01/16 1200  ceFEPIme (MAXIPIME) 1 g in dextrose 5 % 50 mL IVPB  Status:  Discontinued     1 g 100 mL/hr over 30 Minutes Intravenous Every 12 hours 04/01/16 1130 04/04/16 1123   04/01/16 1000  fluconazole (DIFLUCAN) tablet 200 mg  Status:  Discontinued     200 mg Per Tube Daily 04/01/16 0322 04/02/16 1246   04/01/16 0800  piperacillin-tazobactam (ZOSYN) IVPB 3.375 g  Status:  Discontinued     3.375 g 12.5 mL/hr over 240 Minutes Intravenous Every 8 hours 04/01/16 0200 04/01/16 0729   03/31/16 2330  piperacillin-tazobactam (ZOSYN) IVPB 3.375 g     3.375 g 100 mL/hr over 30 Minutes Intravenous  Once 03/31/16 2320 04/01/16 0220      Assessment/Plan: POD 5/1 I and d abd wall abscess  WBC normal since yesterday and abd wall much better, this was one large collection that wasn't drained completely before and I think is now Cont abx Will study g tube today also to make sure fine  [facility] CenterWAKEFIELD,Kehinde Totzke 04/10/2016

## 2016-04-10 NOTE — Progress Notes (Signed)
Pt transported, on vent, from 4E19 to IR and back without incident.

## 2016-04-10 NOTE — Progress Notes (Signed)
Pt is resting comfortably at this time. No distress or complications noted 

## 2016-04-10 NOTE — Progress Notes (Addendum)
Subjective:  Patient states that she is doing well. She has no complaints today. She denies dizziness, shakiness, shortness of breath or weakness.    Addendum: -Discussed with Micro lab - [facility] are [facility] or so. -Discussed with ID - patient to receive 2 weeks of antibiotics starting today, pending no further abscess formations. Ideally [facility] and adjust regimen. -Discussed with Marja KaysFelicia Carter of [facility] - They are able to provide Avycaz and Tygacil if those are the antibiotics we continue at transfer of care. They also have surgery who will follow wounds. We will keep them updated on transfer planning.   Objective:  Vital signs in last 24 hours: Vitals:   04/10/16 0323 04/10/16 0419 04/10/16 0729 04/10/16 0817  BP: (!) 119/30 (!) 102/35 (!) 120/109 (!) 94/21  Pulse: 87 78 75   Resp: 19 16 17    Temp:  97.9 F (36.6 C)  98.6 F (37 C)  TempSrc:  Oral  Oral  SpO2: 100% 100% 100%   Weight:      Height:       Constitutional: NAD, VS reviewed CV: RRR, no murmurs, rubs or gallops appreciated, no LE edema Resp: trach in place with sputum and secretions present; mechanical breath sounds present bilaterally, with rhonchi, no wheezing Abd: G tube in place, soft, +BS, bandage in place over 2 I&D sites  GU: Foley cath in place, rectal tube in place Neuro: Alert; neuro grossly intact; asking appropriate questions  Assessment/Plan:  Principal Problem:   Hyperkalemia Active Problems:   Anemia   Pressure injury of skin   Ventilator associated pneumonia (HCC)   Hyperglycemia   Hyponatremia   Ventilator dependence (HCC)   UTI due to Klebsiella species   Recurrent colitis due to Clostridium difficile   Abdominal wall cellulitis   PICC (peripherally inserted central catheter) in place   Infection due to carbapenemase producing organism   Pneumonia of right lower lobe due to Klebsiella  pneumoniae (HCC)   Abnormal amount of sputum   HCAP (healthcare-associated pneumonia)   VRE (vancomycin-resistant Enterococci) infection   Septicemia due to Klebsiella pneumoniae (HCC)   Abscess of abdominal wall   Idiopathic hypotension   Acute respiratory failure with hypoxia (HCC)  Carbapenem resistant K. pneumoniae bacteriemia:  Patient remains afebrile. Repeat blood cultures since PICC removal and antibiotic broadening are NGTD x2 days.  --continue Avycaz (dosing per pharm) --f/u repeat BCx from 11/25 --asymptomatic hypotension that is improving.   Abdominal wall cellulitis and abscess:  POD 5/1 S/p I&D x2 per surgery. Cultures growing K pneumoniae - sensititive to ESBL, and VRE sensitive to linezolid and ampicillin. Fluoroscopy to evaluate if G tube may be leaking into abdominal wall. --ceftazidime/avibactam; tygacil  --continue monitoring, surgery following --f/u fluoroscopy for G tube leaking  Complicated UTI: UCx from facility showed K pneumoniae growth; repeat cultures here have not had good results as too many organisms growing. --ceftazidime/avibactam per pharmacy, which is treating the kleb. bacteremia  Acute on Chronic anemia: Hgb 8.9 this AM. Will continue monitoring for further bleeding. --continue to monitor for signs and symptoms of bleeding --f/u AM CBC  Ventilator Dependent:  Respiratory therapy doing pressure support trials. Pulmonary following - appreciate their assistance --duonebs q6hrs  G-tube dependent:  --continue tube feeds --fluoroscopy per surgery to evaluate function  C difficile colonization:   C dif testing showed +antigen, - toxin, and +PCR; colonization vs infection as patient was already  on antibiotic coverage during time of testing. --vanc per G tube.   T2DM: BCG's stable overnight. IVF D5NS. --Lantus 5U qhs --SSI - s  Chronically ill state: Patient with long term trach-G tube and multiple serious, active issues. She had been on  decadron 4mg  daily (we are presuming for adrenal insufficiency). ACTH stim test was negative so we have been tapering it (currently at 2mg  daily). We are initiating physiology cortisol replacement. --hydrocortisone 15mg  AM, 5mg  PM --Tapering down Decadron to 1mg  daily --Pepcid per G tube for stress ulcer prophylaxis.  FEN: tube feeds per G tube per nutrition, free water 100ml per G tube CODE: DNR DVT PPx: lovenox  Dispo: Anticipated discharge in approximately 3-5 day(s).    Nyra MarketGorica Averianna Brugger, MD 04/10/2016, 8:58 AM Pager [phone]

## 2016-04-11 LAB — GLUCOSE, CAPILLARY
GLUCOSE-CAPILLARY: 143 mg/dL — AB (ref 65–99)
Glucose-Capillary: 103 mg/dL — ABNORMAL HIGH (ref 65–99)
Glucose-Capillary: 116 mg/dL — ABNORMAL HIGH (ref 65–99)
Glucose-Capillary: 142 mg/dL — ABNORMAL HIGH (ref 65–99)
Glucose-Capillary: 151 mg/dL — ABNORMAL HIGH (ref 65–99)
Glucose-Capillary: 93 mg/dL (ref 65–99)

## 2016-04-11 LAB — PROTEIN ELECTROPHORESIS, SERUM
A/G RATIO SPE: 0.9 (ref 0.7–1.7)
ALPHA-1-GLOBULIN: 0.2 g/dL (ref 0.0–0.4)
ALPHA-2-GLOBULIN: 0.7 g/dL (ref 0.4–1.0)
Albumin ELP: 1.8 g/dL — ABNORMAL LOW (ref 2.9–4.4)
Beta Globulin: 0.6 g/dL — ABNORMAL LOW (ref 0.7–1.3)
GLOBULIN, TOTAL: 2 g/dL — AB (ref 2.2–3.9)
Gamma Globulin: 0.5 g/dL (ref 0.4–1.8)
Total Protein ELP: 3.8 g/dL — ABNORMAL LOW (ref 6.0–8.5)

## 2016-04-11 LAB — CBC
HCT: 25 % — ABNORMAL LOW (ref 36.0–46.0)
Hemoglobin: 8.1 g/dL — ABNORMAL LOW (ref 12.0–15.0)
MCH: 30.2 pg (ref 26.0–34.0)
MCHC: 32.4 g/dL (ref 30.0–36.0)
MCV: 93.3 fL (ref 78.0–100.0)
PLATELETS: 101 10*3/uL — AB (ref 150–400)
RBC: 2.68 MIL/uL — ABNORMAL LOW (ref 3.87–5.11)
RDW: 18.1 % — AB (ref 11.5–15.5)
WBC: 8.1 10*3/uL (ref 4.0–10.5)

## 2016-04-11 LAB — BASIC METABOLIC PANEL
Anion gap: 4 — ABNORMAL LOW (ref 5–15)
BUN: 31 mg/dL — AB (ref 6–20)
CALCIUM: 7 mg/dL — AB (ref 8.9–10.3)
CO2: 22 mmol/L (ref 22–32)
CREATININE: 0.46 mg/dL (ref 0.44–1.00)
Chloride: 113 mmol/L — ABNORMAL HIGH (ref 101–111)
GFR calc Af Amer: >60 mL/min (ref >60)
GLUCOSE: 129 mg/dL — AB (ref 65–99)
Potassium: 3.8 mmol/L (ref 3.5–5.1)
Sodium: 139 mmol/L (ref 135–145)

## 2016-04-11 LAB — MISCELLANEOUS TEST

## 2016-04-11 MED ORDER — HYDROCORTISONE 5 MG PO TABS: 5.0000 mg | ORAL_TABLET | Freq: Every day | ORAL | Status: AC

## 2016-04-11 MED ORDER — INSULIN GLARGINE 100 UNIT/ML ~~LOC~~ SOLN
3.0000 [IU] | Freq: Every day | SUBCUTANEOUS | 11 refills | Status: AC
Start: 2016-04-11 — End: ?

## 2016-04-11 MED ORDER — VANCOMYCIN 50 MG/ML ORAL SOLUTION: 125.0000 mg | Freq: Four times a day (QID) | ORAL | Status: AC

## 2016-04-11 MED ORDER — ONDANSETRON HCL 4 MG/2ML IJ SOLN
4.0000 mg | Freq: Three times a day (TID) | INTRAMUSCULAR | Status: DC | PRN
  Administered 2016-04-11 (×2): 4 mg via INTRAVENOUS
  Filled 2016-04-11 (×2): qty 2

## 2016-04-11 MED ORDER — HYDROCORTISONE 5 MG PO TABS: 15.0000 mg | ORAL_TABLET | ORAL | Status: AC

## 2016-04-11 MED ORDER — DEXTROSE 5 % IV SOLN: 1.2500 g | Freq: Three times a day (TID) | INTRAVENOUS | 0 refills | Status: AC

## 2016-04-11 MED ORDER — GERHARDT'S BUTT CREAM: 1.0000 "application " | TOPICAL_CREAM | Freq: Four times a day (QID) | CUTANEOUS | Status: AC

## 2016-04-11 MED ORDER — TIGECYCLINE 50 MG IV SOLR: 50.0000 mg | Freq: Two times a day (BID) | INTRAVENOUS | Status: AC

## 2016-04-11 MED ORDER — ADULT MULTIVITAMIN LIQUID CH: 15.0000 mL | Freq: Every day | ORAL | Status: AC

## 2016-04-11 NOTE — Progress Notes (Signed)
Called [facility] and spoke to H&R Blocksatu Sesay   RN     Pt will be transferred per ambulance later today with trach and dressing supplies. No personal items found.

## 2016-04-11 NOTE — Progress Notes (Signed)
Subjective: Mild abdominal pain. Awake, alert and on vent. One episode of emesis overnight.   Objective: Vital signs in last 24 hours: Temp:  [97.9 F (36.6 C)-98.2 F (36.8 C)] 97.9 F (36.6 C) (11/29 0356) Pulse Rate:  [71-98] 76 (11/29 0722) Resp:  [18-23] 21 (11/29 0722) BP: (85-135)/(50-69) 97/65 (11/29 0722) SpO2:  [100 %] 100 % (11/29 0722) FiO2 (%):  [40 %] 40 % (11/29 0722) Last BM Date: 04/11/16  Intake/Output from previous day: 11/28 0701 - 11/29 0700 In: 3839 [I.V.:1728; NG/GT:761; IV Piggyback:350] Out: 1175 [Urine:275; Stool:900] Intake/Output this shift: No intake/output data recorded.  General appearance: alert, cooperative, no distress and pleasant, lying in bed. Resp: on vent, normal effort GI: Nondistended, mild generalized tenderness, mildline wound healing without signs of infection, 2 open wounds with bloody drainage, packing replaced, minimal surrounding erythema, g tube in place and site clean  Lab Results:   Recent Labs  04/10/16 0253 04/11/16 0241  WBC 9.6 8.1  HGB 8.9* 8.1*  HCT 27.4* 25.0*  PLT 127* 101*    BMET  Recent Labs  04/10/16 0253 04/11/16 0241  NA 138 139  K 4.0 3.8  CL 111 113*  CO2 23 22  GLUCOSE 165* 129*  BUN 33* 31*  CREATININE 0.40* 0.46  CALCIUM 7.2* 7.0*   PT/INR No results for input(s): LABPROT, INR in the last 72 hours.   Recent Labs Lab 04/10/16 0921  AST 25  ALT 21  ALKPHOS 88  BILITOT 0.1*  PROT 3.9*  ALBUMIN 1.4*     Lipase  No results found for: LIPASE   Studies/Results: Ir Cm Inj Any Colonic Tube W/fluoro  Result Date: 04/10/2016 INDICATION: Status post superficial incision and drainage of subcutaneous infection surrounding gastrostomy insertion site. Injection of the indwelling gastrostomy tube has been requested to assess for any leakage of gastric contents into the abdominal wall. EXAM: GI TUBE INJECTION MEDICATIONS: None ANESTHESIA/SEDATION: None CONTRAST:  50 mL Isovue-300 -  administered into the gastric lumen. FLUOROSCOPY TIME:  Fluoroscopy Time: 48 seconds.  5.8 mGy. COMPLICATIONS: None. PROCEDURE: Under fluoroscopy, contrast injection was performed in different projections via the pre-existing balloon retention gastrostomy tube. Fluoroscopic spot images and loops were saved. FINDINGS: The gastrostomy tube is appropriately positioned within the lumen of the stomach. Injected contrast material enters the stomach and shows no evidence of leakage into the abdominal wall or extravasation into the peritoneal cavity. No fistula is identified to the skin or elsewhere in the abdomen. IMPRESSION: Unremarkable gastrostomy tube injection demonstrating no evidence of abnormal contrast leakage, extravasation or fistula. Electronically Signed   By: Aletta Edouard M.D.   On: 04/10/2016 15:02   Dg Chest Port 1 View  Result Date: 04/10/2016 CLINICAL DATA:  Acute respiratory failure with hypoxia. EXAM: PORTABLE CHEST 1 VIEW COMPARISON:  04/03/2016 FINDINGS: Tracheostomy tube and cardiomediastinal silhouette are unchanged allowing for differences in patient rotation. Lung volumes remain mildly diminished. Left greater than right lung opacities have not significantly changed and are most notable in the left base. No large pleural effusion or pneumothorax is identified. Old bilateral rib fractures are again seen. IMPRESSION: Unchanged left greater than right lung airspace opacities. Electronically Signed   By: Logan Bores M.D.   On: 04/10/2016 07:40    Medications: . busPIRone  10 mg Per Tube TID  . ceftazidime avibactam (AVYCAZ) IVPB  1.25 g Intravenous Q8H  . chlorhexidine gluconate (MEDLINE KIT)  15 mL Mouth Rinse BID  . dexamethasone  1 mg Per Tube Daily  .  enoxaparin (LOVENOX) injection  40 mg Subcutaneous Q24H  . famotidine  20 mg Per Tube Daily  . feeding supplement (PRO-STAT SUGAR FREE 64)  30 mL Per Tube BID  . free water  100 mL Per Tube Q8H  . Gerhardt's butt cream   Topical  QID  . hydrocortisone  15 mg Oral Q24H   And  . hydrocortisone  5 mg Oral Q24H  . insulin aspart  0-9 Units Subcutaneous Q4H  . insulin glargine  3 Units Subcutaneous QHS  . ipratropium-albuterol  3 mL Nebulization TID  . lidocaine-EPINEPHrine  20 mL Intradermal Once  . mouth rinse  15 mL Mouth Rinse QID  . mirtazapine  15 mg Per Tube QHS  . multivitamin  15 mL Per Tube Daily  . sodium chloride flush  3 mL Intravenous Q12H  . tigecycline (TYGACIL) IVPB  50 mg Intravenous Q12H  . traMADol  25 mg Per Tube BID  . vancomycin  125 mg Per Tube Q6H    Assessment/Plan POD 5/2 I&D abd wall abscess - WBC normal today, wound improving, packing replaced  - Cont abx  - g tube study yesterday showed unremarkable g tube injection demonstrating no evidence of abnormal contrast leakage. Continue tube feeds      LOS: 11 days    Kalman Drape 04/11/2016 [phone]

## 2016-04-11 NOTE — Progress Notes (Signed)
[facility]    Date of Admission:  03/31/2016   Total days of antibiotics 12        Day 6 ceftaz-avibactam        Day 4 tigecycline        Day 9 oral vanco   ID: Janice Watson is a 82 y.o. female with  with  VDRF, COPD, abdominal abscess evidence for VAP, and possibly Clostridium difficile, now found to have K PC klebsiella pneumonia bacteremia, KPC Klebsiella s/p I x D Principal Problem:   Hyperkalemia Active Problems:   Anemia   Pressure injury of skin   Ventilator associated pneumonia (HCC)   Hyperglycemia   Hyponatremia   Ventilator dependence ([place])   UTI due to Klebsiella species   Recurrent colitis due to Clostridium difficile   Abdominal wall cellulitis   PICC (peripherally inserted central catheter) in place   Infection due to carbapenemase producing organism   Pneumonia of right lower lobe due to Klebsiella pneumoniae ([place])   Abnormal amount of sputum   HCAP (healthcare-associated pneumonia)   VRE (vancomycin-resistant Enterococci) infection   Septicemia due to Klebsiella pneumoniae ([place])   Abscess of abdominal wall   Idiopathic hypotension   Acute respiratory failure with hypoxia ([place])   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 Patient is feeling better today.  Interval event: CRE- kleb pneumo isolate is sensitive to avycaz  Medications:  . busPIRone  10 mg Per Tube TID  . ceftazidime avibactam (AVYCAZ) IVPB  1.25 g Intravenous Q8H  . chlorhexidine gluconate (MEDLINE KIT)  15 mL Mouth Rinse BID  . enoxaparin (LOVENOX) injection  40 mg Subcutaneous Q24H  . famotidine  20 mg Per Tube Daily  . feeding supplement (PRO-STAT SUGAR FREE 64)  30 mL Per Tube BID  . free water  100 mL Per Tube Q8H  . Gerhardt's butt cream   Topical QID  . hydrocortisone  15 mg Oral Q24H   And  . hydrocortisone  5 mg Oral Q24H  . insulin aspart  0-9 Units Subcutaneous Q4H  . insulin glargine  3 Units  Subcutaneous QHS  . ipratropium-albuterol  3 mL Nebulization TID  . lidocaine-EPINEPHrine  20 mL Intradermal Once  . mouth rinse  15 mL Mouth Rinse QID  . mirtazapine  15 mg Per Tube QHS  . multivitamin  15 mL Per Tube Daily  . sodium chloride flush  3 mL Intravenous Q12H  . tigecycline (TYGACIL) IVPB  50 mg Intravenous Q12H  . traMADol  25 mg Per Tube BID  . vancomycin  125 mg Per Tube Q6H    Objective: Vital signs in last 24 hours: Temp:  [97.9 F (36.6 C)-98.3 F (36.8 C)] 98.3 F (36.8 C) (11/29 1208) Pulse Rate:  [57-98] 57 (11/29 1208) Resp:  [17-23] 20 (11/29 1208) BP: (79-135)/(50-69) 83/68 (11/29 1208) SpO2:  [90 %-100 %] 100 % (11/29 1332) FiO2 (%):  [40 %] 40 % (11/29 1332)  Physical Exam  Constitutional:  oriented to person, place, and time. appears well-developed and her appropriate age. No distress.  HENT: [place]/AT, PERRLA, no scleral icterus Mouth/Throat: Oropharynx is clear and moist. No oropharyngeal exudate.  Neck: trach in place Cardiovascular: Normal rate, regular rhythm and normal heart sounds. Exam reveals no gallop and no friction rub.  No murmur heard.  Pulmonary/Chest: Effort normal and breath sounds normal. No respiratory distress.  has no wheezes.  Abdominal: Soft. Bowel sounds are decreased. Bandaged  abdominal wall incision, less erythema. Peg does not appear to have any leaks. has flexiseal in palce Lymphadenopathy: no cervical adenopathy. No axillary adenopathy Neurological: alert and oriented to person, place, and time.  Skin: Skin is warm and dry. No rash noted. No erythema.  Psychiatric: a normal mood and affect.  behavior is normal.    Lab Results  Recent Labs  04/10/16 0253 04/11/16 0241  WBC 9.6 8.1  HGB 8.9* 8.1*  HCT 27.4* 25.0*  NA 138 139  K 4.0 3.8  CL 111 113*  CO2 23 22  BUN 33* 31*  CREATININE 0.40* 0.46    Microbiology: 11/25 blood cx ngtd 11/23 aerobic cx CRE Kleb pneumo and VRE 11/22 aerobic cx CRE kleb pneumo and  VRE 11/19 blood cx CRE kleb pneumo Studies/Results: Diffuse soft tissue air tracking along the left anterior abdominal wall, extending from the left upper quadrant of the left lower quadrant, with a small amount of associated fluid noted in the soft tissues. Findings raise concern for infection with a gas producing organism and necrotizing fasciitis. 2. G-tube noted in expected position. Contrast noted in the stomach. Stomach unremarkable in appearance. 3. Dense consolidation of the left lower lobe, concerning for pneumonia. Rounded opacity at the right middle lobe may reflect a small infectious cavitation, raising concern for atypical infection. Mild right basilar atelectasis noted. 4. Contrast noted within the distal esophagus, reflecting gastroesophageal reflux. 5. Small bilateral renal cysts seen. 6. Scattered aortic atherosclerosis. 7. An incidental finding of potential clinical significance has been found. 1.1 cm cystic lesion at the tail of the pancreas. Recommend follow up pre and post contrast MRI/MRCP or pancreatic protocol CT in 2 years. This recommendation follows ACR consensus guidelines: Management of Incidental Pancreatic Cysts: A White Paper of the ACR Incidental Findings Committee. [place] [id number].**  Assessment/Plan: Intra Abdominal wall abscess= continue on ceftaz-avibactam plus tigecycline for MDRO isolated from 11/23 for CRE kleb pneumo and amp S VRE. Recommend to treat for 14 days, currently day 3 of 14, using combination of tigecycline plus ceftaz-avibactam  Thus abtx to end on dec 9th  CRE kleb pneumo bacteremia= likely secondary to intra-abd wall process. Continue on cefta-avibactam, until dec 9th  cdifficile infection = hx of cdifficile, on repeat testing, toxin is negative by PCR positive. Decision to treat given diarrhea, and ongoing abtx exposure. Plan to treat through duration of IV abtx, with oral vanco per peg 1102m qid x 11 more  days  End date will be dec 9 th.  If she has recurrent loose stool, recommend to check both for toxin production and antigen  [facility] Cell: [phone]: [phone]  04/11/2016, 3:21 PM

## 2016-04-11 NOTE — Progress Notes (Signed)
DR Dwain SarnaWakefield here- changed 2 dressings on ABD MD used iodoform gauze as packing and Allevyn foam placed over areas. Changed dressing on Rt ABD area clean & dressing changed over Rt ABD with wet to dry gauze covered with Pink Allevyn foam. Gerhardt's cream to buttocks near Flexiseal tube and coccyx. Pt is allergic to tape so Allevyn used to keep dressings in place

## 2016-04-11 NOTE — Progress Notes (Signed)
Pt transferred to [facility] by [facility] - Son was notified of transfer

## 2016-04-11 NOTE — Progress Notes (Signed)
Subjective:  Patient states she is doing well. She denies shortness of breath, chest pain; her abdominal pain is improved from yesterday. [facility]  Will contact her son to let him know of the transfer - spoke to him yesterday and updated on situation and possibility of transfer.   [facility] has a bed for her.    Objective:  Vital signs in last 24 hours: Vitals:   04/11/16 0500 04/11/16 0722 04/11/16 0800 04/11/16 1111  BP: (!) 85/50 97/65 (!) 80/59 (!) 79/60  Pulse: 76 76 88 77  Resp: (!) 23 (!) 21 17 17   Temp:   97.9 F (36.6 C)   TempSrc:   Oral   SpO2: 100% 100% 100% 100%  Weight:      Height:       Constitutional: NAD, VS reviewed CV: RRR, no murmurs, rubs or gallops appreciated, no LE edema Resp: trach in place with sputum and secretions present; mechanical breath sounds present bilaterally, with no crackles and no wheezing Abd: G tube in place, soft, +BS, bandage in place over 2 I&D sites  GU: Foley cath in place, rectal tube in place Neuro: Alert; neuro grossly intact; asking appropriate questions  Assessment/Plan:  Principal Problem:   Hyperkalemia Active Problems:   Anemia   Pressure injury of skin   Ventilator associated pneumonia (HCC)   Hyperglycemia   Hyponatremia   Ventilator dependence (HCC)   UTI due to Klebsiella species   Recurrent colitis due to Clostridium difficile   Abdominal wall cellulitis   PICC (peripherally inserted central catheter) in place   Infection due to carbapenemase producing organism   Pneumonia of right lower lobe due to Klebsiella pneumoniae (HCC)   Abnormal amount of sputum   HCAP (healthcare-associated pneumonia)   VRE (vancomycin-resistant Enterococci) infection   Septicemia due to Klebsiella pneumoniae (HCC)   Abscess of abdominal wall   Idiopathic hypotension   Acute respiratory failure with hypoxia (HCC)   Gastrostomy complication (HCC)  Carbapenem resistant K.  pneumoniae bacteriemia:  Patient remains afebrile. Repeat blood cultures since PICC removal and antibiotic broadening are NGTD x3 days.  --continue Avycaz (dosing per pharm) --f/u repeat BCx from 11/25 --asymptomatic hypotension that is improving.   Abdominal wall cellulitis and abscess:  POD 6/2 s/p I&D x2 per surgery. Cultures growing K pneumoniae - sensititive to ESBL, and VRE sensitive to linezolid and ampicillin. Fluoroscopy to evaluate if G tube may be leaking into abdominal wall. G tube is functioning well and not leaking into abdominal wall.  --ceftazidime/avibactam; tygacil  --continue monitoring, surgery following  Complicated UTI: UCx from facility showed K pneumoniae growth; repeat cultures here have not had good results as too many organisms growing. --ceftazidime/avibactam per pharmacy, which is treating the kleb. bacteremia  Acute on Chronic anemia: Hgb 8.1 this AM. Will continue monitoring for further bleeding. --continue to monitor for signs and symptoms of bleeding  Ventilator Dependent:  Respiratory therapy doing pressure support trials. Pulmonary following - appreciate their assistance --duonebs q6hrs  G-tube dependent:  Fluoroscopy showed working G tube without leakage. Overnight, G tube became clogged but became patent - appreciate nursing assistance.  --continue tube feeds  C difficile colonization:   C dif testing showed +antigen, - toxin, and +PCR; colonization vs infection as patient was already on antibiotic coverage during time of testing. --vanc per G tube.   T2DM: BCG's stable overnight. --Lantus 3U qhs --SSI - s  Chronically ill state: Patient with  long term trach-G tube and multiple serious, active issues. She had been on decadron 4mg  daily (we are presuming for adrenal insufficiency). ACTH stim test was negative; we have been tapering decadron and are initiating physiology cortisol replacement as she still clinically appear as if she is adrenally  insufficient. --hydrocortisone 15mg  AM, 5mg  PM --Stop decadron at discharge --Pepcid per G tube for stress ulcer prophylaxis.  FEN: tube feeds per G tube per nutrition, free water 100ml per G tube CODE: DNR DVT PPx: lovenox  Dispo: Anticipated discharge today.    Nyra MarketGorica Umaiza Matusik, MD 04/11/2016, 12:05 PM Pager [phone]

## 2016-04-12 LAB — TYPE AND SCREEN
ABO/RH(D): B NEG
ANTIBODY SCREEN: NEGATIVE
UNIT DIVISION: 0

## 2016-04-12 LAB — CULTURE, BLOOD (ROUTINE X 2)
CULTURE: NO GROWTH
CULTURE: NO GROWTH

## 2016-04-17 LAB — CULTURE, BLOOD (ROUTINE X 2)

## 2016-05-14 DEATH — deceased

## 2016-10-02 NOTE — Progress Notes (Signed)
Physical Therapy Note  These are the G-code for PT eval completed on 04/04/2016.    04/04/16 0805  PT G-Codes **NOT FOR INPATIENT CLASS**  Functional Assessment Tool Used Clinical judgement  Functional Limitation Mobility: Walking and moving around  Mobility: Walking and Moving Around Current Status (J1914(G8978) CK  Mobility: Walking and Moving Around Goal Status (N8295(G8979) CI    Lewis ShockAshly Maci Eickholt, PT, DPT Pager #: [phone] Office #: [phone]

## 2017-07-06 IMAGING — CR DG CHEST 1V PORT
1 series · 1 of 1 positions shown · non-contrast
Comparison: None.

CLINICAL DATA: PICC placement.  Initial encounter.

EXAM:
PORTABLE CHEST 1 VIEW

[AP]
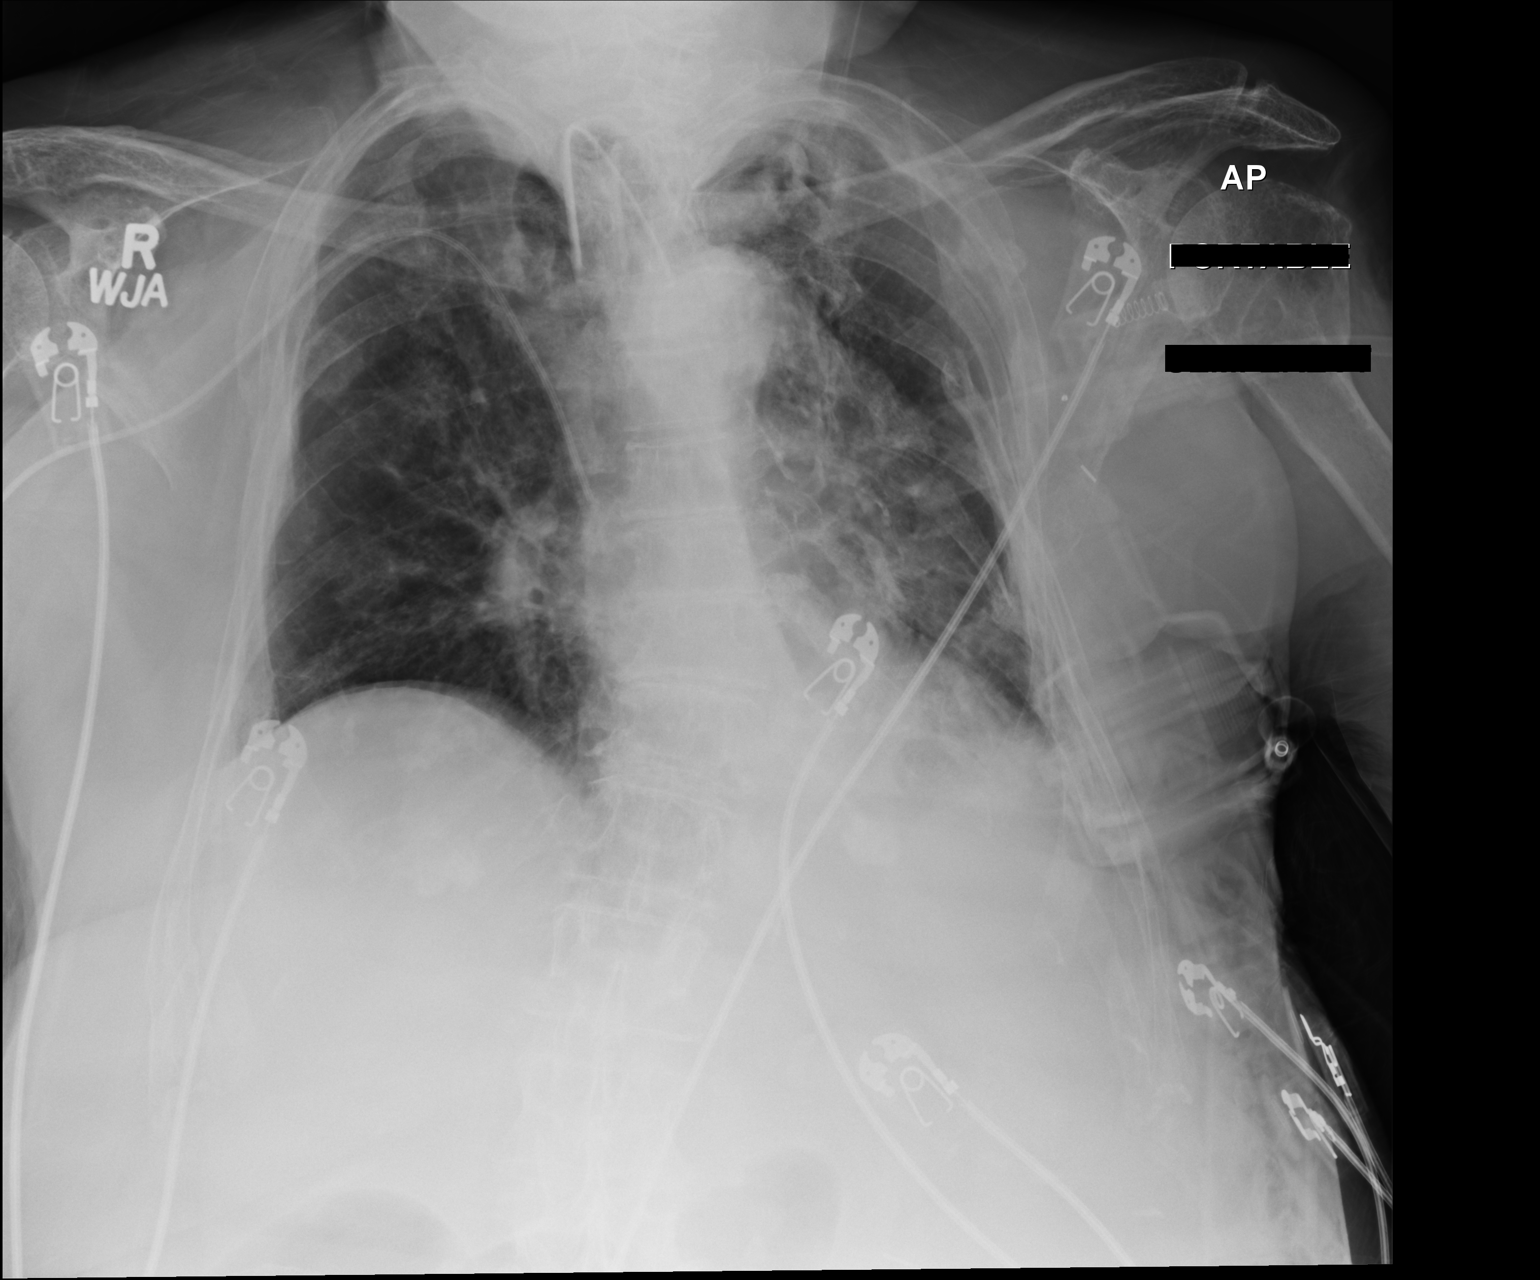

[1 of 1 positions shown; findings below may reference images not displayed]

FINDINGS: A right PICC is noted ending about the mid SVC. The patient's
tracheostomy tube is seen ending 5 cm above the carina.

The lungs are well-aerated. Mild bilateral opacities may reflect
atelectasis or possibly mild pneumonia. Peribronchial thickening is
seen. No definite pleural effusion or pneumothorax is seen.

The cardiomediastinal silhouette is borderline normal in size.
Chronic left-sided rib deformities are noted, and scattered soft
tissue air is noted overlying the left chest wall, of uncertain
significance.
IMPRESSION: 1. Right PICC noted ending about the mid SVC.
2. Mild bilateral opacities may reflect atelectasis or possibly mild
pneumonia. Peribronchial thickening seen.

## 2017-07-10 IMAGING — CT CT ABDOMEN W/ CM
2 of 5 series · 14 of 46 positions shown, 16 images · IV contrast (APPLIED)
Comparison: None.

CLINICAL DATA: Acute onset of left-sided abdominal wall cellulitis.
Question of displaced PEG tube. Initial encounter.

EXAM:
CT ABDOMEN WITH CONTRAST
TECHNIQUE: Multidetector CT imaging of the abdomen was performed using the
standard protocol following bolus administration of intravenous
contrast.
CONTRAST:  100 mL [id number] IOPAMIDOL ([id number]) INJECTION 61%

[Series 2: abd/ pelvis 5.0 i30f 1 · axial · 0.91mm/px · z∈[+1273,+1503]mm · 11 of 54 slices shown, 13 images]
[im 4/54  soft-tissue]
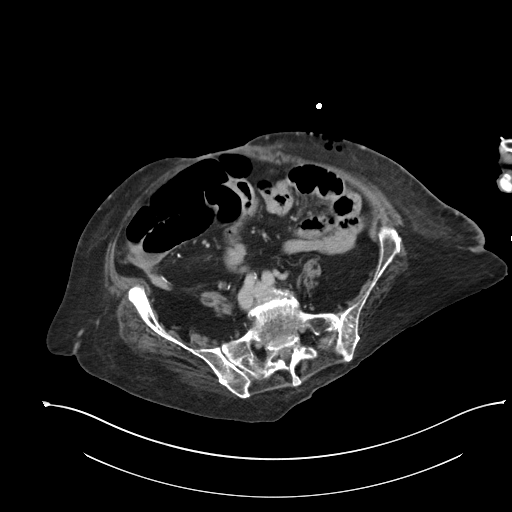
[im 4/54  bone]
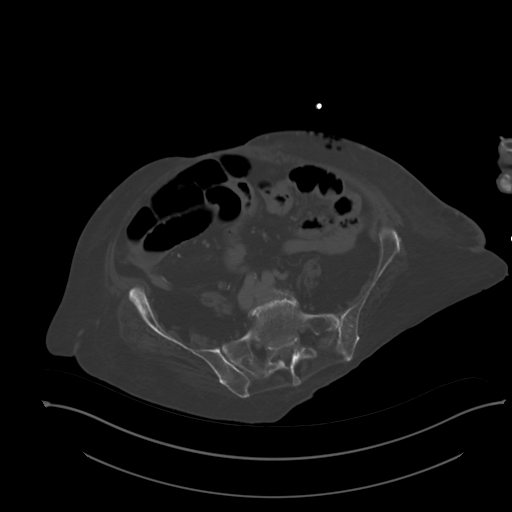
[im 10/54  soft-tissue]
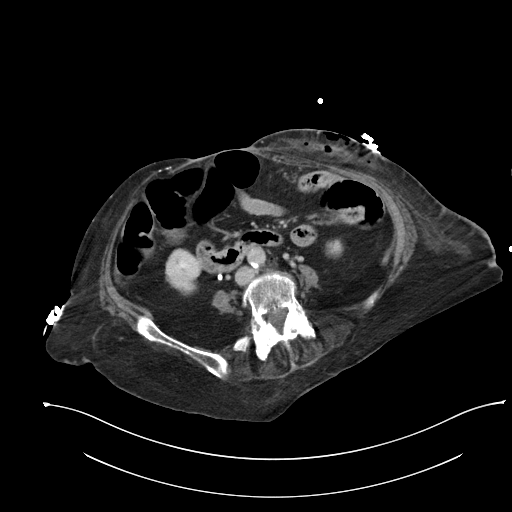
[im 14/54  soft-tissue]
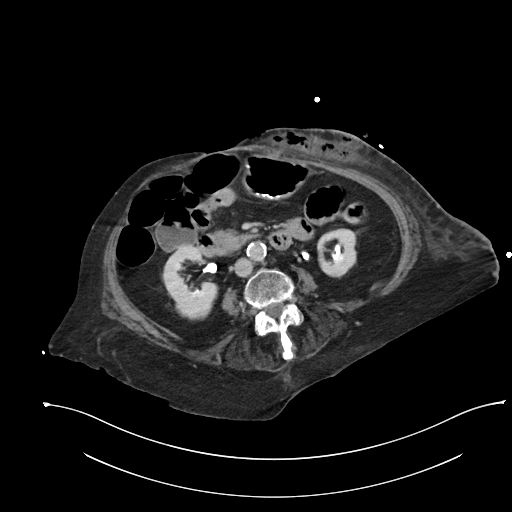
[im 17/54  soft-tissue]
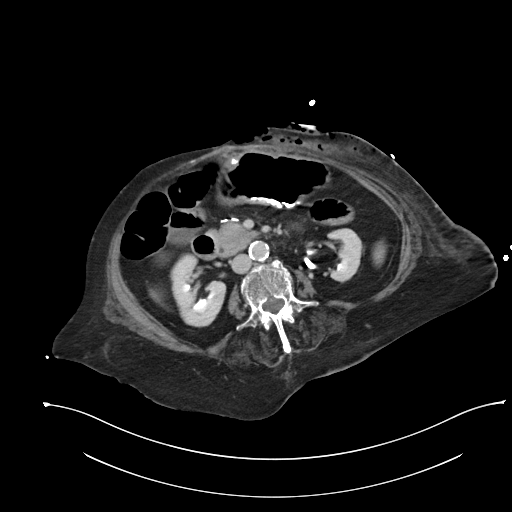
[im 24/54  soft-tissue]
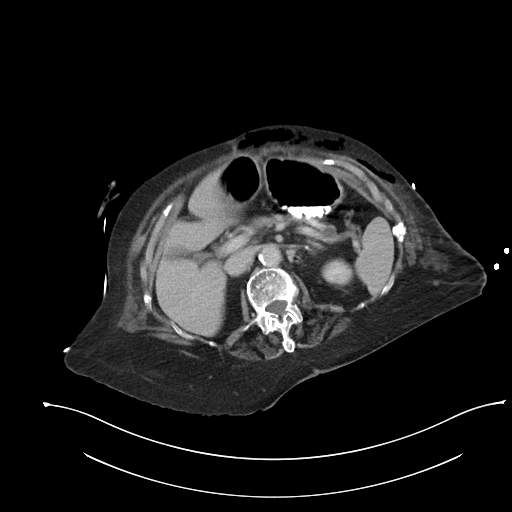
[im 27/54  soft-tissue]
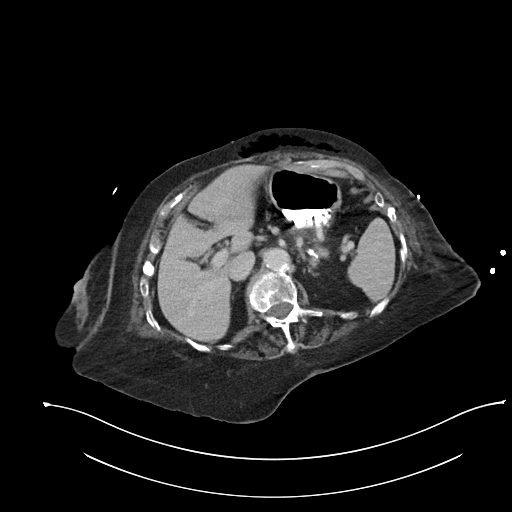
[im 30/54  soft-tissue]
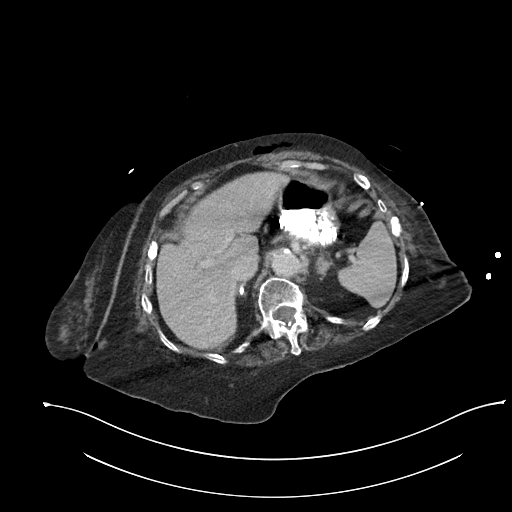
[im 37/54  soft-tissue]
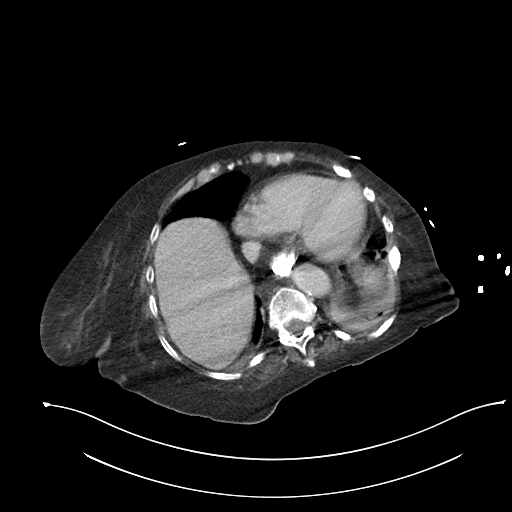
[im 40/54  soft-tissue]
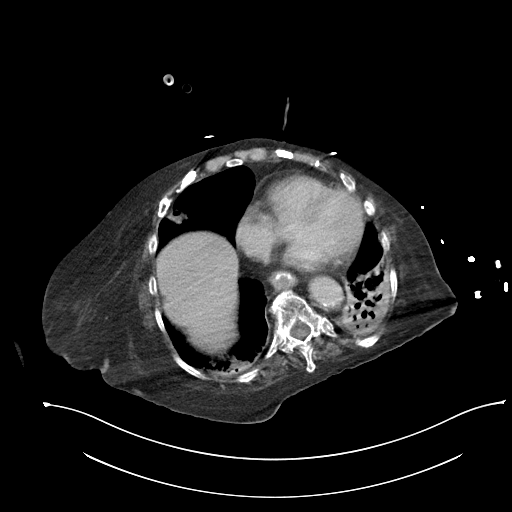
[im 40/54  bone]
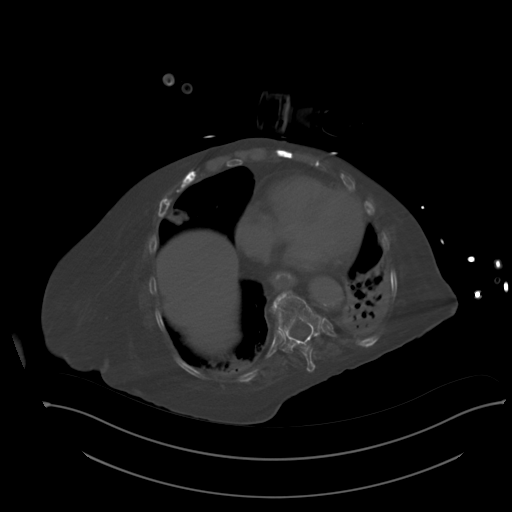
[im 44/54  soft-tissue]
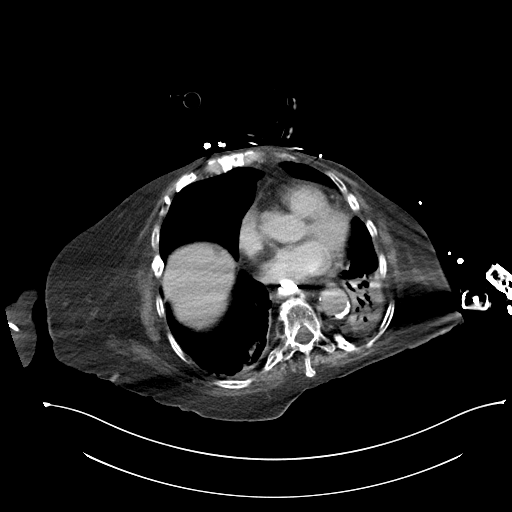
[im 50/54  soft-tissue]
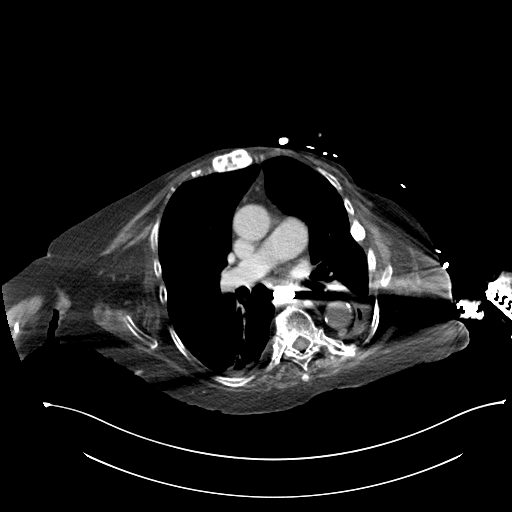

[Series 5: coronal soft tissue · coronal · 0.52mm/px · 3 of 86 slices shown]
[im 29/86  soft-tissue]
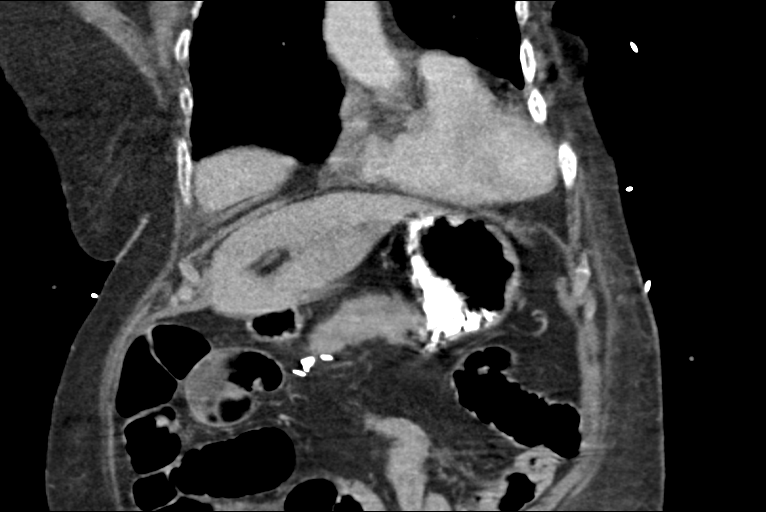
[im 38/86  soft-tissue]
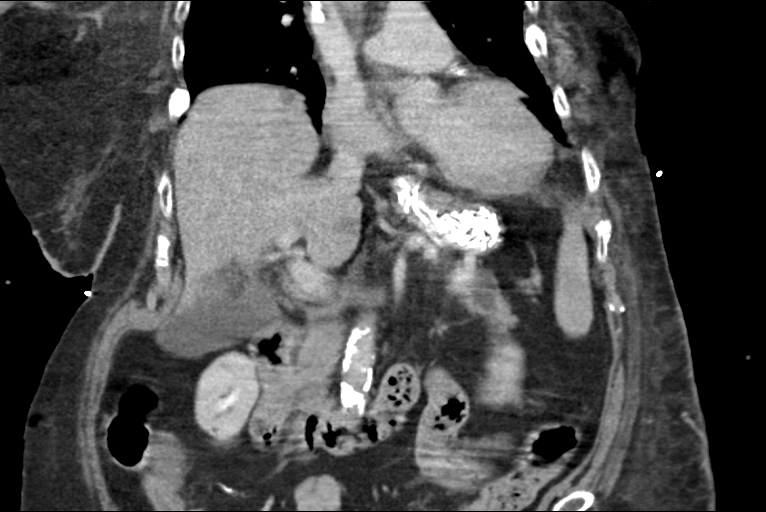
[im 48/86  soft-tissue]
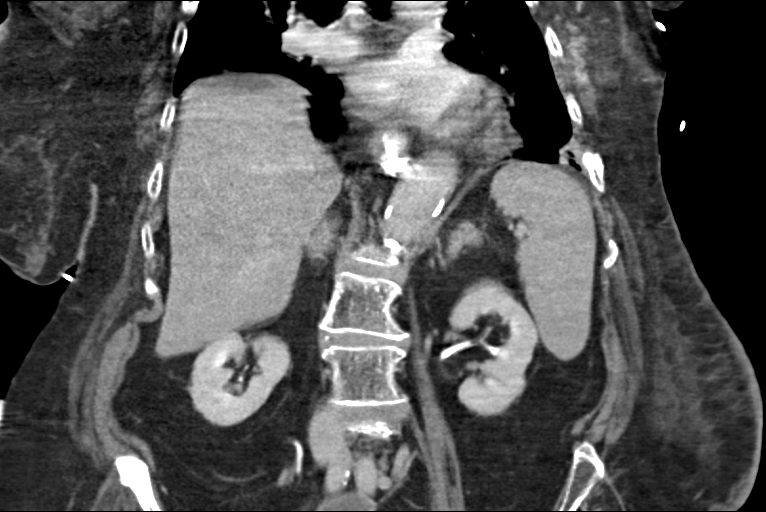

[14 of 46 positions shown; findings below may reference images not displayed]

FINDINGS: Lower chest: There is dense consolidation of the left lower lung
lobe, raising concern for pneumonia. A rounded opacity at the right
middle lobe may reflect a small infectious cavitation. Mild right
basilar atelectasis is noted.

The visualized portions of the mediastinum are unremarkable.
Contrast is noted within the distal esophagus, reflecting
gastroesophageal reflux.

Hepatobiliary: The liver is unremarkable in appearance. The
gallbladder is within normal limits. The common bile duct remains
normal in caliber.

Pancreas: A 1.1 cm cystic lesion is noted at the tail of the
pancreas.

Spleen: The spleen is unremarkable in appearance.

Adrenals/Urinary Tract: Scattered calcifications are seen at the
adrenal glands bilaterally. The adrenal glands are otherwise
unremarkable.

Small bilateral renal cysts are seen. The kidneys are otherwise
unremarkable. There is no evidence of hydronephrosis. No renal or
ureteral stones are identified, though evaluation for stones is
limited given contrast in the renal calyces. No perinephric
stranding is seen.

Stomach/Bowel: A G-tube is noted in expected position, with contrast
noted in the stomach. The stomach is otherwise unremarkable in
appearance. Visualized small and large bowel loops are grossly
unremarkable.

Vascular/Lymphatic: Scattered calcification is seen along the
abdominal aorta and its branches. No retroperitoneal lymphadenopathy
is seen.

Other: Diffuse soft tissue air is seen tracking along the left
anterior abdominal wall, tracking from the left upper quadrant to
the left lower quadrant, with a small amount of associated fluid.
Findings raise concern for infection with a gas producing organism
and necrotizing fasciitis.

Musculoskeletal: No acute osseous abnormalities are identified.
There is minimal grade 1 anterolisthesis of L5 on S1, reflecting
underlying facet disease. The visualized musculature is unremarkable
in appearance.
IMPRESSION: 1. Diffuse soft tissue air tracking along the left anterior
abdominal wall, extending from the left upper quadrant of the left
lower quadrant, with a small amount of associated fluid noted in the
soft tissues. Findings raise concern for infection with a gas
producing organism and necrotizing fasciitis.
2. G-tube noted in expected position. Contrast noted in the stomach.
Stomach unremarkable in appearance.
3. Dense consolidation of the left lower lobe, concerning for
pneumonia. Rounded opacity at the right middle lobe may reflect a
small infectious cavitation, raising concern for atypical infection.
Mild right basilar atelectasis noted.
4. Contrast noted within the distal esophagus, reflecting
gastroesophageal reflux.
5. Small bilateral renal cysts seen.
6. Scattered aortic atherosclerosis.
7. **An incidental finding of potential clinical significance has
been found. 1.1 cm cystic lesion at the tail of the pancreas.
Recommend follow up pre and post contrast MRI/MRCP or pancreatic
protocol CT in 2 years. This recommendation follows ACR consensus
guidelines: Management of Incidental Pancreatic Cysts: A White Paper
of the ACR Incidental Findings Committee. [HOSPITAL]
3939;[DATE].**
These results were called by telephone at the time of interpretation
on 04/04/2016 at [DATE] to Neakson RN on [id number], who verbally
acknowledged these results.

## 2017-07-16 IMAGING — DX DG CHEST 1V PORT
1 series · 1 of 1 positions shown · non-contrast
Comparison: 04/03/2016

CLINICAL DATA: Acute respiratory failure with hypoxia.

EXAM:
PORTABLE CHEST 1 VIEW

[chest ap]
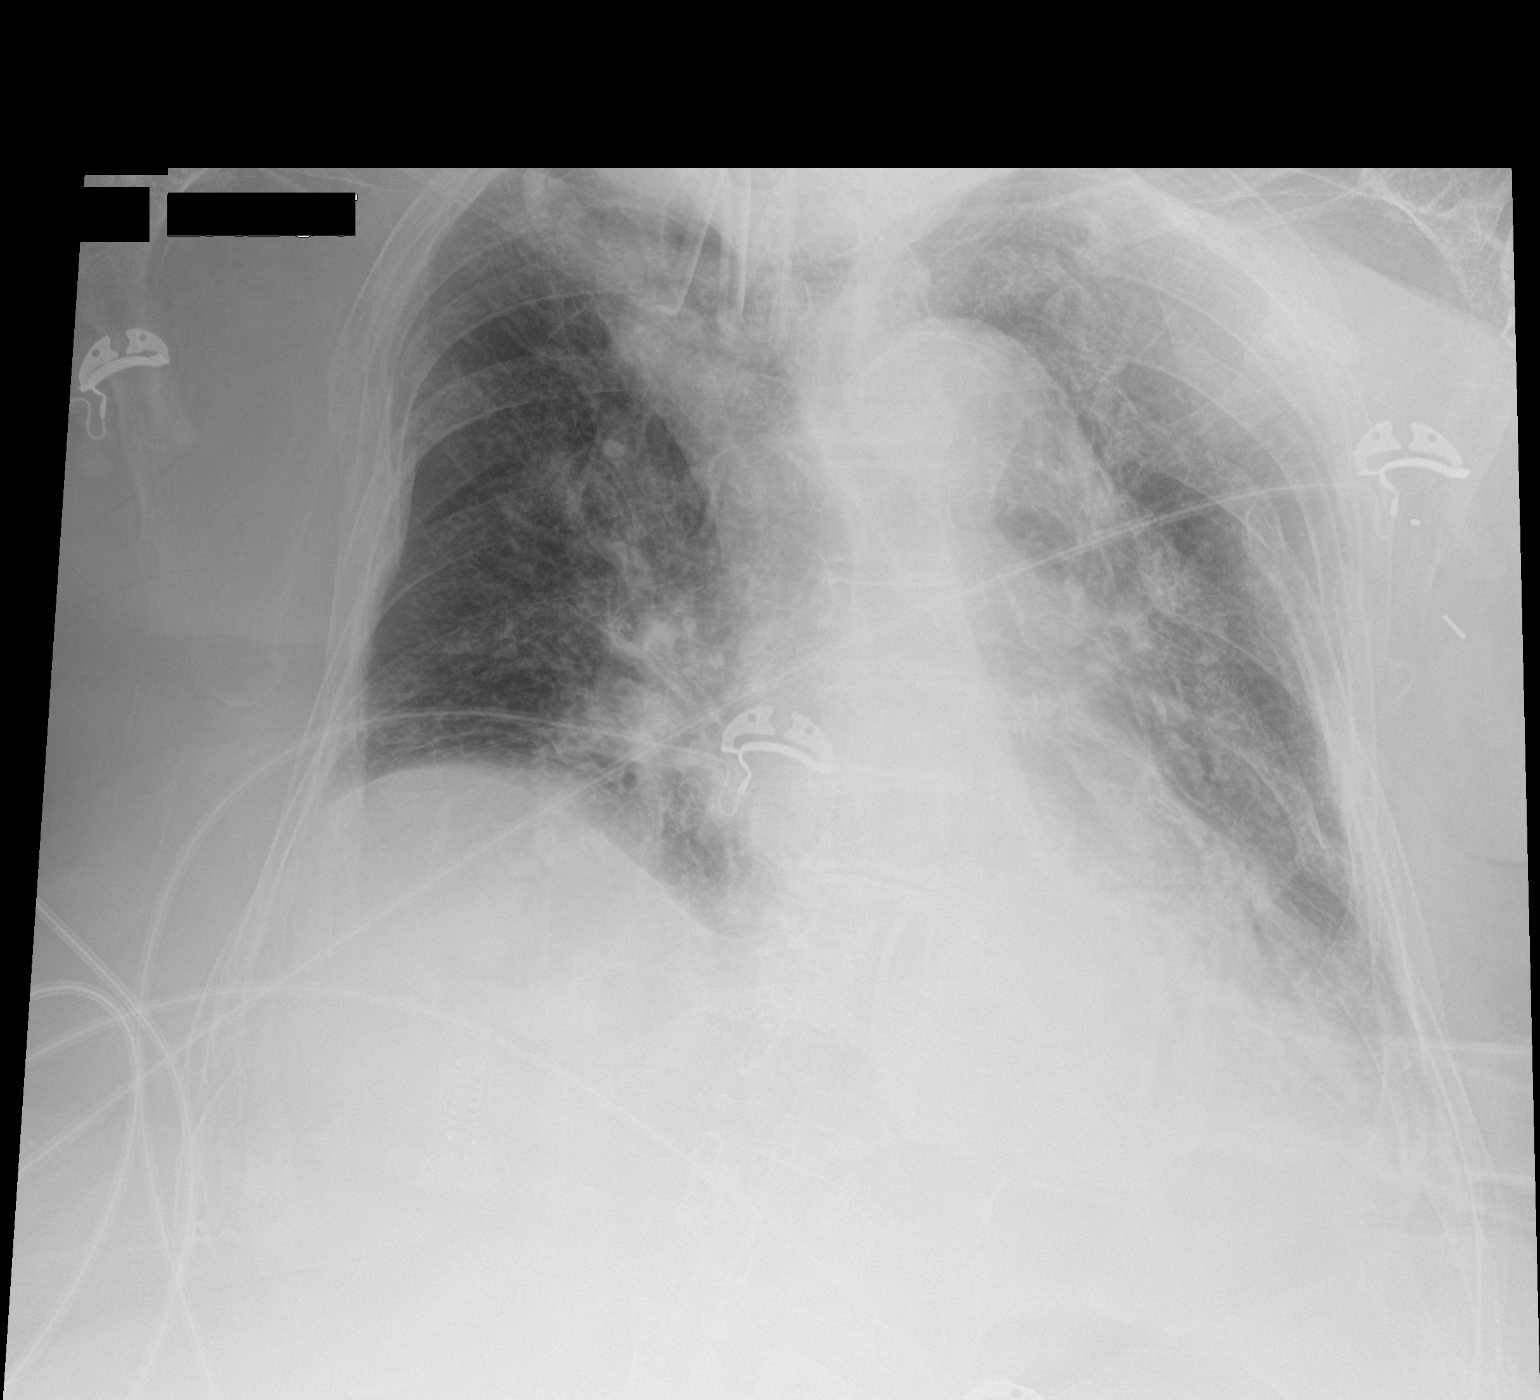

[1 of 1 positions shown; findings below may reference images not displayed]

FINDINGS: Tracheostomy tube and cardiomediastinal silhouette are unchanged
allowing for differences in patient rotation. Lung volumes remain
mildly diminished. Left greater than right lung opacities have not
significantly changed and are most notable in the left base. No
large pleural effusion or pneumothorax is identified. Old bilateral
rib fractures are again seen.
IMPRESSION: Unchanged left greater than right lung airspace opacities.
